# Patient Record
Sex: Female | Born: 1950 | Race: Black or African American | Hispanic: No | Smoking: Never smoker
Health system: Southern US, Community
[De-identification: ages and names within clinical notes are randomized; demographics above are authoritative.]

## PROBLEM LIST (undated history)

## (undated) DIAGNOSIS — M1990 Unspecified osteoarthritis, unspecified site: Secondary | ICD-10-CM

## (undated) DIAGNOSIS — I10 Essential (primary) hypertension: Secondary | ICD-10-CM

## (undated) DIAGNOSIS — K219 Gastro-esophageal reflux disease without esophagitis: Secondary | ICD-10-CM

## (undated) HISTORY — PX: ABDOMINAL HYSTERECTOMY: SHX81

## (undated) HISTORY — PX: CYSTECTOMY: SUR359

## (undated) HISTORY — PX: TONSILLECTOMY: SUR1361

---

## 2006-12-10 ENCOUNTER — Encounter: Admission: RE | Admit: 2006-12-10 | Discharge: 2006-12-10 | Payer: Self-pay | Admitting: Orthopedic Surgery

## 2006-12-17 ENCOUNTER — Ambulatory Visit: Payer: Self-pay | Admitting: Cardiovascular Disease

## 2006-12-17 ENCOUNTER — Observation Stay (HOSPITAL_COMMUNITY): Admission: AD | Admit: 2006-12-17 | Discharge: 2006-12-18 | Payer: Self-pay | Admitting: Orthopedic Surgery

## 2010-10-25 ENCOUNTER — Encounter
Admission: RE | Admit: 2010-10-25 | Discharge: 2010-10-25 | Payer: Self-pay | Source: Home / Self Care | Attending: Family Medicine | Admitting: Family Medicine

## 2011-03-21 NOTE — Consult Note (Signed)
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  Notes no change in her ability to do this.  No chest pressure, no  shortness of breath.   About a week or 2 ago she was coming down the stairs and she fell and  had a rotator cuff injury.  She presents today for repair.   We were asked to see regarding abnormal EKG and risk factors.   ALLERGIES:  EGGS.   MEDICATIONS PRIOR TO ADMISSION:  Premarin and aspirin.   PAST MEDICAL HISTORY:  1. Rotator cuff tear.  2. Status post hysterectomy.  3. Status post umbilical surgery repair.  4. Status post laser tissue removal, throat.  5. Obesity.   SOCIAL HISTORY:  The patient lives in [place], she is married, she is  a Development worker, community.  She does not smoke, does not drink.  She is active without  limitations, though not in an organized exercise fashion.   FAMILY HISTORY:  Mother is alive and well at age 66, father died at age  37, no history of CAD.   REVIEW OF SYSTEMS:  All systems reviewed, negative to the above problem  except as noted.   PHYSICAL EXAM:  GENERAL:  When I examine, the patient is in no distress.  VITAL SIGNS:  Blood pressure is 166/88 (the patient has not noted that  it has been high before), pulse is 99 and regular, temperature is 98.  HEENT:  Normocephalic, atraumatic.  PERRL.  EOMI.  Throat clear.  NECK:  Supple.  No JVD, no bruits.  LUNGS:  Relatively clear.  CARDIAC EXAM:  Regular rate and rhythm, S1 and S2, no S3 or S4.  No  murmurs.  ABDOMEN:  Supple, nontender, no  hepatomegaly.  EXTREMITIES:  Good dorsalis pedis pulses, no lower extremity edema.  NEURO EXAM:  Alert and oriented x3.  Cranial nerves II through XII  grossly intact.  Strength:  Patient moving all extremities.   Chest x-ray on February 13 shows no acute disease.  A 12-lead EKG shows  normal sinus rhythm at 86 beats per minute.  Poor R wave progression  throughout anterior precordium, left axis, left anterior fascicular  block, left axis deviation.   LABS:  Significant for a hemoglobin of 13, WBC of 9, BUN and creatinine  of 9 and 0.7, potassium of 3.3.  Glucose of 151 non-fasting.   IMPRESSION:  Patient is a 60 year old woman with no known history of  coronary disease, has a history of obesity.  Blood pressure is high  today, though it has not been high in the past per her report.  Sugars  are a little high, though  she has no history of diabetes.   EKG most likely reflects lead placement as she has large breasts and,  again, it can lead to poor R wave progression.  There is no definite  evidence for an MI.  In fact, there are very small R waves noted in V1  through V3.   She is active without limitation, in clinic seeing 30 patients, has  noted no change, and denies chest pain.  Overall, I feel she is at low  risk for a major cardiac event and is okay to proceed with the planned  surgery.   Again, after surgery, continue to follow blood pressure.  We will need  follow-up electrolytes and glucose check.  Encouraged her to stay  active.      Pricilla Riffle, MD, [facility]  Electronically Signed     PVR/MEDQ  D:  12/17/2006  T:  12/18/2006  Job:  [id number]

## 2011-03-21 NOTE — Op Note (Signed)
Bridget Perez, Bridget Perez         ACCOUNT NO.:  [id number]   MEDICAL RECORD NO.:  [id number]          PATIENT TYPE:  INP   LOCATION:  2899                         FACILITY:  [facility]   PHYSICIAN:  Myrtie Neither, MD      DATE OF BIRTH:  [birth year]   DATE OF PROCEDURE:  12/17/2006  DATE OF DISCHARGE:                               OPERATIVE REPORT   PREOPERATIVE DIAGNOSIS:  Rotator cuff tear, right shoulder.   POSTOPERATIVE DIAGNOSIS:  Rotator cuff tear, right shoulder, impingement  syndrome, right shoulder.   ANESTHESIA:  General.   PROCEDURE:  1. Arthroscopic acromioplasty and synovectomy.  2. Mini-rotator cuff repair using anchor suture and #2 FiberWire      suture.   The patient was taken to the operating room. After being given adequate  preop medications, given general anesthesia and intubated. Her right  shoulder was prepped with DuraPrep and draped in sterile manner.  A 1/2  inch puncture wound was made posteriorly.  A Swisher rod was placed  posterior to anterior. Anterior inflow water incision was then made.  A  lateral incision was made for the arthroscope and inspection of the  joint revealed complete disruption of the rotator cuff with retraction  of the rotator cuff, chronic synovitic changes of the subacromial bursal  sac, and chondromalacia changes of the subacromial surface.  With the  use of the arthroscopic shaver, a complete synovectomy was done and  decompression followed by the use of a bur for acromioplasty.  After  adequate acromioplasty and decompression was done and other debridement  of hypertrophic subacromial bursal tissue, a mini-incision was made  extending the lateral incision approximately 1 1/2 inch, going through  the skin and subcutaneous tissue down to the fascia.  The deltoid was  split.  Complete disruption of the rotator cuff was identified. With the  use of the Kocher, the cuff tendon was able to be pulled back down.  A  trough was made over  the tuberosity of the humerus with the use of a bur  and osteotome.  Next, an anchor suture was placed into the tuberosity.  The anchor sutures were placed into the cuff tendon pulling it back down  to the tuberosity.  FiberWire was also used to further close the rotator  cuff without losing gaps.  Two small puncture holes were made into the  tuberosity, itself, and FiberWire sutures run through it into the cuff.  A complete closure was possible.  Copious irrigation was then done  followed by 2-0 Vicryl for the deltoid, 2-0 for the subcutaneous, and  skin staples for the skin.  A bulky compressive dressing was applied.  14 mL of 0.5% Marcaine with epinephrine was injected into the area.  A  shoulder abduction pillow brace was applied.  The patient tolerated the  procedure quite well and went to the recovery room in stable and  satisfactory condition.  The patient is being kept for 23 hour  observation for pain control. The patient will be discharged on Percocet  1-2 q.4h. p.r.n. for pain.  Continue ice packs.  Use a Nerf ball for  gripping and  to be seen back in the office in one week.      Myrtie Neither, MD  Electronically Signed    AC/MEDQ  D:  12/17/2006  T:  12/17/2006  Job:  [id number]

## 2014-03-09 ENCOUNTER — Ambulatory Visit: Payer: Self-pay | Admitting: Podiatry

## 2014-03-15 ENCOUNTER — Ambulatory Visit: Payer: Self-pay | Admitting: Podiatry

## 2014-03-20 ENCOUNTER — Ambulatory Visit: Payer: Self-pay | Admitting: Podiatry

## 2017-09-23 DIAGNOSIS — M180 Bilateral primary osteoarthritis of first carpometacarpal joints: Secondary | ICD-10-CM | POA: Insufficient documentation

## 2017-09-23 DIAGNOSIS — G5603 Carpal tunnel syndrome, bilateral upper limbs: Secondary | ICD-10-CM | POA: Insufficient documentation

## 2017-09-23 DIAGNOSIS — R52 Pain, unspecified: Secondary | ICD-10-CM | POA: Insufficient documentation

## 2017-10-01 ENCOUNTER — Other Ambulatory Visit: Payer: Self-pay | Admitting: Orthopedic Surgery

## 2017-10-06 ENCOUNTER — Encounter (HOSPITAL_BASED_OUTPATIENT_CLINIC_OR_DEPARTMENT_OTHER): Payer: Self-pay | Admitting: *Deleted

## 2017-10-06 ENCOUNTER — Other Ambulatory Visit: Payer: Self-pay

## 2017-10-08 ENCOUNTER — Ambulatory Visit (HOSPITAL_BASED_OUTPATIENT_CLINIC_OR_DEPARTMENT_OTHER): Payer: BLUE CROSS/BLUE SHIELD | Admitting: Anesthesiology

## 2017-10-08 ENCOUNTER — Other Ambulatory Visit: Payer: Self-pay

## 2017-10-08 ENCOUNTER — Ambulatory Visit (HOSPITAL_BASED_OUTPATIENT_CLINIC_OR_DEPARTMENT_OTHER)
Admission: RE | Admit: 2017-10-08 | Discharge: 2017-10-08 | Disposition: A | Discharge status: Home / Self Care | Payer: BLUE CROSS/BLUE SHIELD | Source: Ambulatory Visit | Attending: Orthopedic Surgery | Admitting: Orthopedic Surgery

## 2017-10-08 ENCOUNTER — Encounter (HOSPITAL_BASED_OUTPATIENT_CLINIC_OR_DEPARTMENT_OTHER)
Admission: RE | Disposition: A | Discharge status: Home / Self Care | Payer: Self-pay | Source: Ambulatory Visit | Attending: Orthopedic Surgery

## 2017-10-08 ENCOUNTER — Encounter (HOSPITAL_BASED_OUTPATIENT_CLINIC_OR_DEPARTMENT_OTHER): Payer: Self-pay

## 2017-10-08 DIAGNOSIS — K219 Gastro-esophageal reflux disease without esophagitis: Secondary | ICD-10-CM | POA: Insufficient documentation

## 2017-10-08 DIAGNOSIS — M1990 Unspecified osteoarthritis, unspecified site: Secondary | ICD-10-CM | POA: Diagnosis not present

## 2017-10-08 DIAGNOSIS — G5603 Carpal tunnel syndrome, bilateral upper limbs: Secondary | ICD-10-CM | POA: Insufficient documentation

## 2017-10-08 HISTORY — PX: CARPAL TUNNEL RELEASE: SHX101

## 2017-10-08 HISTORY — DX: Unspecified osteoarthritis, unspecified site: M19.90

## 2017-10-08 HISTORY — DX: Gastro-esophageal reflux disease without esophagitis: K21.9

## 2017-10-08 SURGERY — CARPAL TUNNEL RELEASE
Anesthesia: Regional | Site: Wrist | Laterality: Right

## 2017-10-08 MED ORDER — CEFAZOLIN SODIUM-DEXTROSE 2-4 GM/100ML-% IV SOLN
INTRAVENOUS | Status: AC
  Filled 2017-10-08: qty 100

## 2017-10-08 MED ORDER — PHENYLEPHRINE 40 MCG/ML (10ML) SYRINGE FOR IV PUSH (FOR BLOOD PRESSURE SUPPORT)
PREFILLED_SYRINGE | INTRAVENOUS | Status: AC
  Filled 2017-10-08: qty 10

## 2017-10-08 MED ORDER — LACTATED RINGERS IV SOLN
INTRAVENOUS | Status: DC
  Administered 2017-10-08: 08:00:00 via INTRAVENOUS

## 2017-10-08 MED ORDER — LIDOCAINE 2% (20 MG/ML) 5 ML SYRINGE
INTRAMUSCULAR | Status: AC
  Filled 2017-10-08: qty 5

## 2017-10-08 MED ORDER — SCOPOLAMINE 1 MG/3DAYS TD PT72: 1.0000 | MEDICATED_PATCH | Freq: Once | TRANSDERMAL | Status: DC | PRN

## 2017-10-08 MED ORDER — CEFAZOLIN SODIUM-DEXTROSE 2-4 GM/100ML-% IV SOLN: 2.0000 g | INTRAVENOUS | Status: DC

## 2017-10-08 MED ORDER — ONDANSETRON HCL 4 MG/2ML IJ SOLN
INTRAMUSCULAR | Status: AC
  Filled 2017-10-08: qty 2

## 2017-10-08 MED ORDER — ONDANSETRON HCL 4 MG/2ML IJ SOLN
INTRAMUSCULAR | Status: DC | PRN
  Administered 2017-10-08: 4 mg via INTRAVENOUS

## 2017-10-08 MED ORDER — MIDAZOLAM HCL 2 MG/2ML IJ SOLN
1.0000 mg | INTRAMUSCULAR | Status: DC | PRN
  Administered 2017-10-08: 2 mg via INTRAVENOUS

## 2017-10-08 MED ORDER — FENTANYL CITRATE (PF) 100 MCG/2ML IJ SOLN
INTRAMUSCULAR | Status: AC
  Filled 2017-10-08: qty 2

## 2017-10-08 MED ORDER — MIDAZOLAM HCL 2 MG/2ML IJ SOLN
INTRAMUSCULAR | Status: AC
  Filled 2017-10-08: qty 2

## 2017-10-08 MED ORDER — SUCCINYLCHOLINE CHLORIDE 200 MG/10ML IV SOSY
PREFILLED_SYRINGE | INTRAVENOUS | Status: AC
  Filled 2017-10-08: qty 10

## 2017-10-08 MED ORDER — CHLORHEXIDINE GLUCONATE 4 % EX LIQD: 60.0000 mL | Freq: Once | CUTANEOUS | Status: DC

## 2017-10-08 MED ORDER — LIDOCAINE HCL (CARDIAC) 20 MG/ML IV SOLN
INTRAVENOUS | Status: DC | PRN
  Administered 2017-10-08: 30 mg via INTRAVENOUS

## 2017-10-08 MED ORDER — EPHEDRINE 5 MG/ML INJ
INTRAVENOUS | Status: AC
Start: 2017-10-08 — End: 2017-10-08
  Filled 2017-10-08: qty 10

## 2017-10-08 MED ORDER — BUPIVACAINE HCL (PF) 0.25 % IJ SOLN
INTRAMUSCULAR | Status: DC | PRN
  Administered 2017-10-08: 9 mL

## 2017-10-08 MED ORDER — PROPOFOL 10 MG/ML IV BOLUS
INTRAVENOUS | Status: DC | PRN
  Administered 2017-10-08 (×2): 20 mg via INTRAVENOUS

## 2017-10-08 MED ORDER — FENTANYL CITRATE (PF) 100 MCG/2ML IJ SOLN
50.0000 ug | INTRAMUSCULAR | Status: DC | PRN
  Administered 2017-10-08: 100 ug via INTRAVENOUS

## 2017-10-08 MED ORDER — DEXAMETHASONE SODIUM PHOSPHATE 10 MG/ML IJ SOLN
INTRAMUSCULAR | Status: AC
  Filled 2017-10-08: qty 1

## 2017-10-08 SURGICAL SUPPLY — 36 items
BLADE SURG 15 STRL LF DISP TIS (BLADE) ×1 IMPLANT
BLADE SURG 15 STRL SS (BLADE) ×3
BNDG CMPR 9X4 STRL LF SNTH (GAUZE/BANDAGES/DRESSINGS)
BNDG COHESIVE 3X5 TAN STRL LF (GAUZE/BANDAGES/DRESSINGS) ×3 IMPLANT
BNDG ESMARK 4X9 LF (GAUZE/BANDAGES/DRESSINGS) IMPLANT
BNDG GAUZE ELAST 4 BULKY (GAUZE/BANDAGES/DRESSINGS) ×3 IMPLANT
CHLORAPREP W/TINT 26ML (MISCELLANEOUS) ×3 IMPLANT
CORD BIPOLAR FORCEPS 12FT (ELECTRODE) ×3 IMPLANT
COVER BACK TABLE 60X90IN (DRAPES) ×3 IMPLANT
COVER MAYO STAND STRL (DRAPES) ×3 IMPLANT
CUFF TOURNIQUET SINGLE 18IN (TOURNIQUET CUFF) ×3 IMPLANT
DRAPE EXTREMITY T 121X128X90 (DRAPE) ×3 IMPLANT
DRAPE SURG 17X23 STRL (DRAPES) ×3 IMPLANT
DRSG PAD ABDOMINAL 8X10 ST (GAUZE/BANDAGES/DRESSINGS) ×3 IMPLANT
GAUZE SPONGE 4X4 12PLY STRL (GAUZE/BANDAGES/DRESSINGS) ×3 IMPLANT
GAUZE XEROFORM 1X8 LF (GAUZE/BANDAGES/DRESSINGS) ×3 IMPLANT
GLOVE BIOGEL PI IND STRL 7.0 (GLOVE) IMPLANT
GLOVE BIOGEL PI IND STRL 8.5 (GLOVE) ×1 IMPLANT
GLOVE BIOGEL PI INDICATOR 7.0 (GLOVE) ×4
GLOVE BIOGEL PI INDICATOR 8.5 (GLOVE) ×2
GLOVE ECLIPSE 6.5 STRL STRAW (GLOVE) ×2 IMPLANT
GLOVE SURG ORTHO 8.0 STRL STRW (GLOVE) ×3 IMPLANT
GOWN STRL REUS W/ TWL LRG LVL3 (GOWN DISPOSABLE) ×1 IMPLANT
GOWN STRL REUS W/TWL LRG LVL3 (GOWN DISPOSABLE) ×3
GOWN STRL REUS W/TWL XL LVL3 (GOWN DISPOSABLE) ×3 IMPLANT
NDL PRECISIONGLIDE 27X1.5 (NEEDLE) IMPLANT
NEEDLE PRECISIONGLIDE 27X1.5 (NEEDLE) IMPLANT
NS IRRIG 1000ML POUR BTL (IV SOLUTION) ×3 IMPLANT
PACK BASIN DAY SURGERY FS (CUSTOM PROCEDURE TRAY) ×3 IMPLANT
STOCKINETTE 4X48 STRL (DRAPES) ×3 IMPLANT
SUT ETHILON 4 0 PS 2 18 (SUTURE) ×3 IMPLANT
SUT VICRYL 4-0 PS2 18IN ABS (SUTURE) IMPLANT
SYR BULB 3OZ (MISCELLANEOUS) ×3 IMPLANT
SYR CONTROL 10ML LL (SYRINGE) IMPLANT
TOWEL OR 17X24 6PK STRL BLUE (TOWEL DISPOSABLE) ×3 IMPLANT
UNDERPAD 30X30 (UNDERPADS AND DIAPERS) ×3 IMPLANT

## 2017-10-08 NOTE — Op Note (Signed)
Dictation Number [id number]

## 2017-10-08 NOTE — Anesthesia Postprocedure Evaluation (Signed)
Anesthesia Post Note  Patient: Psychologist, sport and exerciseVeita Bland-Spencer  Procedure(s) Performed: RIGHT CARPAL TUNNEL RELEASE (Right Wrist)     Patient location during evaluation: PACU Anesthesia Type: Bier Block Level of consciousness: awake and alert Pain management: pain level controlled Vital Signs Assessment: post-procedure vital signs reviewed and stable Respiratory status: spontaneous breathing, nonlabored ventilation and respiratory function stable Cardiovascular status: stable and blood pressure returned to baseline Postop Assessment: no apparent nausea or vomiting Anesthetic complications: no    Last Vitals:  Vitals:   10/08/17 0911 10/08/17 0915  BP:  112/73  Pulse: 81 79  Resp: 15 19  Temp:    SpO2: 100% 100%    Last Pain:  Vitals:   10/08/17 0915  TempSrc:   PainSc: 0-No pain                 Cecile HearingStephen Edward Makina Skow

## 2017-10-08 NOTE — Op Note (Signed)
[name],               ACCOUNT NO.:  [id number]  MEDICAL RECORD NO.:  [id number]  LOCATION:                                 FACILITY:  PHYSICIAN:  Cindee SaltGary Izell Labat, M.D.            DATE OF BIRTH:  DATE OF PROCEDURE:  10/08/2017 DATE OF DISCHARGE:                              OPERATIVE REPORT   PREOPERATIVE DIAGNOSIS:  Carpal tunnel syndrome, right hand.  POSTOPERATIVE DIAGNOSIS:  Carpal tunnel syndrome, right hand.  OPERATION:  Decompression right median nerve.  SURGEON:  Cindee SaltGary Deedra Pro, MD.  ANESTHESIA:  Forearm-based IV regional with IV sedation and local infiltration.  [facility]  ANESTHESIOLOGIST:  Desmond Lopeurk.  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  EMG nerve conductions are positive revealing carpal tunnel syndrome bilaterally.  She has elected to undergo surgical decompression rather than further conservative treatment.  Pre, peri, and postoperative course have been discussed along with risks and complications.  She is aware that there is no guarantee to the surgery; the possibility of infection; recurrence of injury to arteries, nerves, tendons; incomplete relief of symptoms; and dystrophy.  In preoperative area, the patient is seen, the extremity marked by both the patient and surgeon.  Antibiotic given.  DESCRIPTION OF PROCEDURE:  The patient was brought to the operating room, where a forearm-based IV regional anesthetic was carried out without difficulty under the direction of the anesthesia department. She was prepped using ChloraPrep in the supine position with the right arm free.  A 3-minute dry time was allowed, and a time-out taken confirming the patient and procedure.  A longitudinal incision was made in the right palm, carried down through subcutaneous tissue.  Bleeders were electrocauterized with bipolar.  The palmar fascia was split.  The superficial palmar arch was  identified.  The flexor tendon to the ring and little finger identified.  To the ulnar side of the median nerve, the carpal retinaculum was incised with sharp dissection.  A right angle and Sewell retractor were placed between the skin and forearm fascia. The fascia was released for approximately a 1.5 to 2 cm proximal to the wrist crease under direct vision.  Care was taken to dissect any deep structures away from the proximal forearm fascia, flexor retinaculum, and the nerve was explored.  An area of compression was immediately apparent.  The motor branch entered into muscle from the ulnar aspect of the nerve was intact.  The wound was copiously irrigated with saline. The skin was closed with interrupted 4-0 nylon sutures.  A local infiltration with 0.25% bupivacaine was given, approximately 9 mL was used.  A sterile compressive dressing with fingers free was applied.  On deflation of the tourniquet, all fingers immediately pinked.  She was taken to the recovery room for observation in satisfactory condition. She will be discharged home [facility], on Ultram, which he has at home.          ______________________________ Cindee SaltGary Erle Guster, M.D.     GK/MEDQ  D:  10/08/2017  T:  10/08/2017  Job:  [id number]

## 2017-10-08 NOTE — Brief Op Note (Signed)
10/08/2017  9:04 AM  PATIENT:  Bridget Perez  66 y.o. female  PRE-OPERATIVE DIAGNOSIS:  RIGHT CARPAL TUNNEL SYNDROME  POST-OPERATIVE DIAGNOSIS:  RIGHT CARPAL TUNNEL SYNDROME  PROCEDURE:  Procedure(s): RIGHT CARPAL TUNNEL RELEASE (Right)  SURGEON:  Surgeon(s) and Role:    * Cindee SaltKuzma, Vaishnavi Dalby, MD - Primary  PHYSICIAN ASSISTANT:   ASSISTANTS: none   ANESTHESIA:   local, regional and IV sedation  EBL:  2 mL   BLOOD ADMINISTERED:none  DRAINS: none   LOCAL MEDICATIONS USED:  BUPIVICAINE   SPECIMEN:  No Specimen  DISPOSITION OF SPECIMEN:  N/A  COUNTS:  YES  TOURNIQUET:   Total Tourniquet Time Documented: Forearm (Right) - 18 minutes Total: Forearm (Right) - 18 minutes   DICTATION: .Other Dictation: Dictation Number [id number]  PLAN OF CARE:discharge to home after PACU  PATIENT DISPOSITION:  PACU - hemodynamically stable.

## 2017-10-08 NOTE — Transfer of Care (Signed)
Immediate Anesthesia Transfer of Care Note  Patient: Bridget Perez  Procedure(s) Performed: RIGHT CARPAL TUNNEL RELEASE (Right Wrist)  Patient Location: PACU  Anesthesia Type:MAC and Bier block  Level of Consciousness: awake, alert  and oriented  Airway & Oxygen Therapy: Patient Spontanous Breathing and Patient connected to face mask oxygen  Post-op Assessment: Report given to RN and Post -op Vital signs reviewed and stable  Post vital signs: Reviewed and stable  Last Vitals:  Vitals:   10/08/17 0753  BP: 127/62  Pulse: 83  Resp: 18  Temp: 36.6 C  SpO2: 99%    Last Pain:  Vitals:   10/08/17 0753  TempSrc: Oral         Complications: No apparent anesthesia complications

## 2017-10-08 NOTE — Anesthesia Preprocedure Evaluation (Addendum)
Anesthesia Evaluation  Patient identified by MRN, date of birth, ID band Patient awake    Reviewed: Allergy & Precautions, NPO status , Patient's Chart, lab work & pertinent test results  Airway Mallampati: II  TM Distance: >3 FB Neck ROM: Full    Dental  (+) Teeth Intact, Dental Advisory Given   Pulmonary neg pulmonary ROS,    Pulmonary exam normal breath sounds clear to auscultation       Cardiovascular negative cardio ROS Normal cardiovascular exam Rhythm:Regular Rate:Normal     Neuro/Psych negative neurological ROS     GI/Hepatic Neg liver ROS, GERD  ,  Endo/Other  Obesity   Renal/GU negative Renal ROS     Musculoskeletal  (+) Arthritis , Osteoarthritis,    Abdominal   Peds  Hematology negative hematology ROS (+)   Anesthesia Other Findings Day of surgery medications reviewed with the patient.  Reproductive/Obstetrics                             Anesthesia Physical Anesthesia Plan  ASA: II  Anesthesia Plan: Bier Block and Bier Block-LIDOCAINE ONLY   Post-op Pain Management:    Induction: Intravenous  PONV Risk Score and Plan: 2 and Propofol infusion, Treatment may vary due to age or medical condition and Midazolam  Airway Management Planned: Nasal Cannula  Additional Equipment:   Intra-op Plan:   Post-operative Plan:   Informed Consent: I have reviewed the patients History and Physical, chart, labs and discussed the procedure including the risks, benefits and alternatives for the proposed anesthesia with the patient or authorized representative who has indicated his/her understanding and acceptance.   Dental advisory given  Plan Discussed with:   Anesthesia Plan Comments: (Risks/benefits of regional block discussed with patient including risk of bleeding, infection, nerve damage, and possibility of failed block.  Also discussed backup plan of general anesthesia and  associated risks.  Patient wishes to proceed.)        Anesthesia Quick Evaluation

## 2017-10-08 NOTE — Discharge Instructions (Signed)

## 2017-10-08 NOTE — H&P (Signed)
  Bridget Perez is an 66 y.o. female.   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 She states been going on for years. She is awake and 4 out of 7 nights. She complains of a sharp pain when it occurs with a VAS score 7/10. Driving does cause problems for her but has no other day fix activities nothing seems to make it better or worse. She has worn a splint taken prednisone which has helped. She has no history of injury to the hand or to the neck. She has no history of diabetes thyroid problems arthritis. Family history is positive diabetes negative for thyroid problems arthritis and gout. She has been tested for diabetes. She was sent to Dr. Riccardo DubinKarvelas for nerve conductions.Her nerve conductions are reviewed with her. She shows a motor delay of 6.9 on her right side is sent sensory delay of 6.8. Her motor delay on the left is 5.2 sensory delay 4.3              Past Medical History:  Diagnosis Date  . Arthritis   . GERD (gastroesophageal reflux disease)     Past Surgical History:  Procedure Laterality Date  . ABDOMINAL HYSTERECTOMY      History reviewed. No pertinent family history. Social History:  reports that  has never smoked. she has never used smokeless tobacco. She reports that she drinks alcohol. She reports that she does not use drugs.  Allergies: No Known Allergies  No medications prior to admission.    No results found for this or any previous visit (from the past 48 hour(s)).  No results found.   Pertinent items are noted in HPI.  Height 5\' 4"  (1.626 m), weight 99.8 kg (220 lb).  General appearance: alert, cooperative and appears stated age Head: Normocephalic, without obvious abnormality Neck: no JVD Resp: clear to auscultation bilaterally Cardio: regular rate and rhythm, S1,  S2 normal, no murmur, click, rub or gallop GI: soft, non-tender; bowel sounds normal; no masses,  no organomegaly Extremities: numbness right hand Pulses: 2+ and symmetric Skin: Skin color, texture, turgor normal. No rashes or lesions Neurologic: Grossly normal Incision/Wound: na  Assessment/Plan  1. Primary osteoarthritis of both first carpometacarpal joints  2. Bilateral carpal tunnel syndrome    Plan: She would like to proceed to have a right carpal tunnel release. Pre-peri-and postoperative course were discussed along with her. She is aware there is no guarantee to the surgery the possibility of infection recurrence injury to arteries nerves tendons complete relief symptoms dystrophy. She is scheduled for right carpal tunnel release in outpatient under regional anesthesia.      [name] R 10/08/2017, 5:43 AM

## 2017-10-09 ENCOUNTER — Encounter (HOSPITAL_BASED_OUTPATIENT_CLINIC_OR_DEPARTMENT_OTHER): Payer: Self-pay | Admitting: Orthopedic Surgery

## 2018-03-18 ENCOUNTER — Ambulatory Visit
Admission: RE | Admit: 2018-03-18 | Discharge: 2018-03-18 | Disposition: A | Discharge status: Home / Self Care | Payer: BLUE CROSS/BLUE SHIELD | Source: Ambulatory Visit | Attending: Family Medicine | Admitting: Family Medicine

## 2018-03-18 ENCOUNTER — Other Ambulatory Visit: Payer: Self-pay | Admitting: Family Medicine

## 2018-03-18 DIAGNOSIS — R059 Cough, unspecified: Secondary | ICD-10-CM

## 2018-03-18 DIAGNOSIS — R05 Cough: Secondary | ICD-10-CM

## 2018-04-22 ENCOUNTER — Ambulatory Visit (INDEPENDENT_AMBULATORY_CARE_PROVIDER_SITE_OTHER): Payer: BLUE CROSS/BLUE SHIELD

## 2018-04-22 ENCOUNTER — Encounter: Payer: Self-pay | Admitting: Podiatry

## 2018-04-22 ENCOUNTER — Ambulatory Visit (INDEPENDENT_AMBULATORY_CARE_PROVIDER_SITE_OTHER): Payer: BLUE CROSS/BLUE SHIELD | Admitting: Podiatry

## 2018-04-22 DIAGNOSIS — M779 Enthesopathy, unspecified: Secondary | ICD-10-CM

## 2018-04-22 DIAGNOSIS — R52 Pain, unspecified: Secondary | ICD-10-CM

## 2018-04-22 DIAGNOSIS — M25572 Pain in left ankle and joints of left foot: Secondary | ICD-10-CM

## 2018-04-22 MED ORDER — TRIAMCINOLONE ACETONIDE 10 MG/ML IJ SUSP
10.0000 mg | Freq: Once | INTRAMUSCULAR | Status: AC
  Administered 2018-04-22: 10 mg

## 2018-04-22 NOTE — Progress Notes (Signed)
Subjective:   Patient ID: Bridget Perez, female   DOB: 67 y.o.   MRN: [id number]   HPI Patient presents with pain in the medial aspect of the left ankle that she states is been there for around a month and gradually getting worse but she is had a 30-year history of mild discomfort with severe flatfoot deformity left over right.  Patient does not smoke and likes to be active   Review of Systems  All other systems reviewed and are negative.       Objective:  Physical Exam  Constitutional: She appears well-developed and well-nourished.  Cardiovascular: Intact distal pulses.  Pulmonary/Chest: Effort normal.  Musculoskeletal: Normal range of motion.  Neurological: She is alert.  Skin: Skin is warm.  Nursing note and vitals reviewed.   Neurovascular status intact muscle strength is adequate with patient found to have severe flatfoot deformity left with minimal range of motion and crepitus with inflammation fluid around the medial side of the posterior tibial tendon as it comes under the medial malleolus.  The right shows moderate depression of the arch not to the same degree and patient is taking diclofenac and does have good digital perfusion and is well oriented x3     Assessment:  Acute posterior tibial tendinitis left with severe foot structural issues as compounding factor     Plan:  H&P conditions reviewed careful sheath injection administered left 3 mg Kenalog 5 Milgram Xylocaine applied fascial brace to lift the arch.  Discussed possibility for AFO bracing some day and possibility for MRI if symptoms do not get better to consider tear of the tendon  X-ray indicates severe flatfoot deformity left over right with multiple signs of midtarsal subtalar joint arthritis

## 2018-04-22 NOTE — Progress Notes (Signed)
dg 

## 2018-05-04 ENCOUNTER — Other Ambulatory Visit (HOSPITAL_COMMUNITY): Payer: Self-pay | Admitting: Family Medicine

## 2018-05-04 DIAGNOSIS — R1310 Dysphagia, unspecified: Secondary | ICD-10-CM

## 2018-05-13 ENCOUNTER — Ambulatory Visit (HOSPITAL_COMMUNITY): Payer: Medicare Other

## 2018-05-17 ENCOUNTER — Ambulatory Visit (HOSPITAL_COMMUNITY): Admission: RE | Admit: 2018-05-17 | Payer: BLUE CROSS/BLUE SHIELD | Source: Ambulatory Visit

## 2018-05-31 ENCOUNTER — Ambulatory Visit (HOSPITAL_COMMUNITY)
Admission: RE | Admit: 2018-05-31 | Discharge: 2018-05-31 | Disposition: A | Discharge status: Home / Self Care | Payer: BLUE CROSS/BLUE SHIELD | Source: Ambulatory Visit | Attending: Family Medicine | Admitting: Family Medicine

## 2018-05-31 DIAGNOSIS — R1310 Dysphagia, unspecified: Secondary | ICD-10-CM | POA: Diagnosis present

## 2018-05-31 DIAGNOSIS — K449 Diaphragmatic hernia without obstruction or gangrene: Secondary | ICD-10-CM | POA: Diagnosis not present

## 2018-05-31 DIAGNOSIS — K219 Gastro-esophageal reflux disease without esophagitis: Secondary | ICD-10-CM | POA: Diagnosis not present

## 2018-05-31 DIAGNOSIS — K224 Dyskinesia of esophagus: Secondary | ICD-10-CM | POA: Insufficient documentation

## 2020-02-24 ENCOUNTER — Encounter: Payer: Self-pay | Admitting: Podiatry

## 2020-02-24 ENCOUNTER — Other Ambulatory Visit: Payer: Self-pay

## 2020-02-24 ENCOUNTER — Ambulatory Visit (INDEPENDENT_AMBULATORY_CARE_PROVIDER_SITE_OTHER): Payer: BC Managed Care – PPO | Admitting: Podiatry

## 2020-02-24 VITALS — Temp 96.6°F

## 2020-02-24 DIAGNOSIS — M778 Other enthesopathies, not elsewhere classified: Secondary | ICD-10-CM | POA: Diagnosis not present

## 2020-02-24 DIAGNOSIS — M779 Enthesopathy, unspecified: Secondary | ICD-10-CM

## 2020-02-27 NOTE — Progress Notes (Signed)
Subjective:   Patient ID: Bridget Perez, female   DOB: 69 y.o.   MRN: [id number]   HPI Patient presents stating her left ankle has flared up again recently   ROS      Objective:  Physical Exam  Neurovascular status intact with discomfort around the posterior tibial tendon as it comes under the medial malleolus left inserting into the navicular with moderate flatfoot deformity with patient who has lost significant weight     Assessment:  Posterior tibial tendinitis left with inflammation and foot structural changes     Plan:  Reviewed condition sterile prep done injected the tendon 3 mg Kenalog followed M Xylocaine after sterile prep and advised on supportive shoes

## 2020-07-06 ENCOUNTER — Ambulatory Visit (INDEPENDENT_AMBULATORY_CARE_PROVIDER_SITE_OTHER): Payer: BC Managed Care – PPO

## 2020-07-06 ENCOUNTER — Encounter: Payer: Self-pay | Admitting: Podiatry

## 2020-07-06 ENCOUNTER — Other Ambulatory Visit: Payer: Self-pay

## 2020-07-06 ENCOUNTER — Other Ambulatory Visit: Payer: Self-pay | Admitting: Podiatry

## 2020-07-06 ENCOUNTER — Ambulatory Visit (INDEPENDENT_AMBULATORY_CARE_PROVIDER_SITE_OTHER): Payer: BC Managed Care – PPO | Admitting: Podiatry

## 2020-07-06 DIAGNOSIS — M76822 Posterior tibial tendinitis, left leg: Secondary | ICD-10-CM | POA: Diagnosis not present

## 2020-07-06 DIAGNOSIS — R609 Edema, unspecified: Secondary | ICD-10-CM

## 2020-07-06 DIAGNOSIS — M1990 Unspecified osteoarthritis, unspecified site: Secondary | ICD-10-CM | POA: Diagnosis not present

## 2020-07-06 NOTE — Progress Notes (Signed)
Subjective:   Patient ID: Bridget Perez, female   DOB: 69 y.o.   MRN: [id number]   HPI Patient states she started to develop pain around her left ankle again in the last few weeks and has been worse this time worse when she tries to be on it and then at nighttime.  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  I do think that ultimately triple arthrodesis will be necessary and she understands this but she also needs her right knee replacement and does not know when this would work for her due to work.  We are still trying to get it better conservatively and I did go ahead I did sterile prep I injected the posterior tibial as it comes across the medial malleolus 3 mg dexamethasone Kenalog 5 mg Xylocaine and I applied air fracture walker to mobilize and discussed MRI at 1 point future if possible AFO brace  X-rays indicate that there is collapse medial longitudinal arch left with multiple signs of arthritis of the subtalar joint calcaneocuboid talonavicular joint

## 2020-08-10 ENCOUNTER — Ambulatory Visit (INDEPENDENT_AMBULATORY_CARE_PROVIDER_SITE_OTHER): Payer: BC Managed Care – PPO | Admitting: Podiatry

## 2020-08-10 ENCOUNTER — Other Ambulatory Visit: Payer: Self-pay

## 2020-08-10 ENCOUNTER — Encounter: Payer: Self-pay | Admitting: Podiatry

## 2020-08-10 DIAGNOSIS — M76822 Posterior tibial tendinitis, left leg: Secondary | ICD-10-CM | POA: Diagnosis not present

## 2020-08-10 NOTE — Progress Notes (Signed)
Subjective:   Patient ID: Bridget Perez, female   DOB: 69 y.o.   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  We are trying to continue to work this conservatively due to her work schedule and I spoke with Biomedical scientist and will get a try to make her a brace or a deep type of an orthotic to try to cuff the area and get her into a better functioning position.  Patient will be seen back for this by ped orthotist and education rendered concerning condition with patient today.  I did dispense fascial brace to try to get temporary lifting of the arch

## 2020-08-24 ENCOUNTER — Telehealth: Payer: Self-pay | Admitting: Podiatry

## 2020-08-24 NOTE — Telephone Encounter (Signed)
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   I returned call and left my direct number for Dr Parke Simmers to call to schedule an appt.

## 2020-09-17 ENCOUNTER — Other Ambulatory Visit: Payer: Self-pay | Admitting: Family Medicine

## 2020-09-17 ENCOUNTER — Other Ambulatory Visit: Payer: Self-pay

## 2020-09-17 ENCOUNTER — Ambulatory Visit
Admission: RE | Admit: 2020-09-17 | Discharge: 2020-09-17 | Disposition: A | Discharge status: Home / Self Care | Payer: Self-pay | Source: Ambulatory Visit | Attending: Family Medicine | Admitting: Family Medicine

## 2020-09-17 DIAGNOSIS — T1490XA Injury, unspecified, initial encounter: Secondary | ICD-10-CM

## 2021-02-02 ENCOUNTER — Other Ambulatory Visit (HOSPITAL_BASED_OUTPATIENT_CLINIC_OR_DEPARTMENT_OTHER): Payer: Self-pay

## 2021-04-20 ENCOUNTER — Emergency Department (HOSPITAL_BASED_OUTPATIENT_CLINIC_OR_DEPARTMENT_OTHER)
Admission: EM | Admit: 2021-04-20 | Discharge: 2021-04-21 | Disposition: A | Discharge status: Home / Self Care | Payer: BC Managed Care – PPO | Attending: Emergency Medicine | Admitting: Emergency Medicine

## 2021-04-20 ENCOUNTER — Encounter (HOSPITAL_BASED_OUTPATIENT_CLINIC_OR_DEPARTMENT_OTHER): Payer: Self-pay | Admitting: Emergency Medicine

## 2021-04-20 ENCOUNTER — Other Ambulatory Visit: Payer: Self-pay

## 2021-04-20 DIAGNOSIS — S99922A Unspecified injury of left foot, initial encounter: Secondary | ICD-10-CM | POA: Diagnosis present

## 2021-04-20 DIAGNOSIS — S91352A Open bite, left foot, initial encounter: Secondary | ICD-10-CM | POA: Diagnosis not present

## 2021-04-20 DIAGNOSIS — W5911XA Bitten by nonvenomous snake, initial encounter: Secondary | ICD-10-CM | POA: Insufficient documentation

## 2021-04-20 DIAGNOSIS — Z5321 Procedure and treatment not carried out due to patient leaving prior to being seen by health care provider: Secondary | ICD-10-CM | POA: Diagnosis not present

## 2021-04-20 MED ORDER — ACETAMINOPHEN 325 MG PO TABS
650.0000 mg | ORAL_TABLET | Freq: Once | ORAL | Status: AC
  Administered 2021-04-20: 650 mg via ORAL
  Filled 2021-04-20: qty 2

## 2021-04-20 NOTE — ED Triage Notes (Addendum)
Pt arrives pov, reports possible snake bite on left foot 30 minutes pta. Pt reports that she did not see what injured foot. No swelling noted. Pt reports throbbing pain. Redness and tenderness noted, unable to visualize puncture marks.

## 2021-04-20 NOTE — ED Notes (Signed)
Bridget Perez, EDPA to triage for [facility]

## 2021-04-20 NOTE — ED Notes (Signed)
Pt not in lobby at this time.

## 2021-04-20 NOTE — ED Provider Notes (Signed)
Emergency Medicine Provider Triage Evaluation Note  Bridget Perez , a 70 y.o. female  was evaluated in triage.  Pt complains of left toe pain.  She states that she was on the deck of her house removing covers from furniture when she feels that something bit her on the left great toe.  She states that this occurred at approximately 7:00.  She denies any swelling she states that she occasionally has moments of pain in her left toe.  Review of Systems  Positive: Left great toe pain Negative: Fevers  Physical Exam  BP 135/70 (BP Location: Right Arm)   Pulse 75   Temp 99.1 F (37.3 C) (Oral)   Resp 20   Ht 5\' 3"  (1.6 m)   Wt 76.2 kg   SpO2 100%   BMI 29.76 kg/m  Gen:   Awake, no distress  Resp:  Normal effort MSK:   Moves extremities without difficulty Other:      Medical Decision Making  Medically screening exam initiated at 7:51 PM.  Appropriate orders placed.  Mikele Bland-Spencer was informed that the remainder of the evaluation will be completed by another provider, this initial triage assessment does not replace that evaluation, and the importance of remaining in the ED until their evaluation is complete.  Patient states she does not appreciate any swelling to her foot or toe I do not appreciate any either.  She does have a fallen arch on her left foot and her ankle does appear somewhat large however she states that is normal for her.  Recommended that patient wait in ER for full evaluation.  She is understanding of this plan.  Given Tylenol for discomfort.  Anticipate patient will be reasonable discharge home after short observation period.    Adrian Saran [place], DOLE 04/20/21 1954    04/22/21, MD 04/23/21 1432

## 2021-08-19 ENCOUNTER — Ambulatory Visit: Payer: BC Managed Care – PPO | Admitting: Podiatry

## 2021-08-19 ENCOUNTER — Ambulatory Visit (INDEPENDENT_AMBULATORY_CARE_PROVIDER_SITE_OTHER): Payer: BC Managed Care – PPO

## 2021-08-19 ENCOUNTER — Ambulatory Visit: Payer: BC Managed Care – PPO

## 2021-08-19 ENCOUNTER — Other Ambulatory Visit: Payer: Self-pay

## 2021-08-19 ENCOUNTER — Encounter: Payer: Self-pay | Admitting: Podiatry

## 2021-08-19 DIAGNOSIS — M76822 Posterior tibial tendinitis, left leg: Secondary | ICD-10-CM

## 2021-08-19 DIAGNOSIS — M19072 Primary osteoarthritis, left ankle and foot: Secondary | ICD-10-CM | POA: Diagnosis not present

## 2021-08-19 NOTE — Progress Notes (Signed)
Subjective:   Patient ID: Bridget Perez, female   DOB: 70 y.o.   MRN: [id number]   HPI Patient presents stating she is having a lot of pain in her left foot and states it was worse but she started an anti-inflammatory but some improved.  She knows she is getting need surgery but is trying to connect this also with the knee on her right foot and has not been seen in over a year   ROS      Objective:  Physical Exam  Neurovascular status unchanged weight doing very well with patient found to have quite a bit of inflammation around the medial and lateral ankle but no increased erythema noted or significant edema.  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  Ultimately she is going to require surgery but first I would like to be able to put her into some form of brace and have recommended an AFO bracing system to try to provide stability if she most likely cannot have surgery for at least 12 to 18 months.  Patient will be seen back for [place] to recheck again all questions discussed and answered today  X-rays indicate that there is severe collapse medial longitudinal arch left with complete reduction of the arch height multiple signs of arthritic processes

## 2022-02-05 ENCOUNTER — Ambulatory Visit (INDEPENDENT_AMBULATORY_CARE_PROVIDER_SITE_OTHER): Payer: BC Managed Care – PPO

## 2022-02-05 ENCOUNTER — Ambulatory Visit: Payer: Medicare Other

## 2022-02-05 ENCOUNTER — Encounter: Payer: Self-pay | Admitting: Podiatry

## 2022-02-05 ENCOUNTER — Ambulatory Visit: Payer: BC Managed Care – PPO | Admitting: Podiatry

## 2022-02-05 DIAGNOSIS — M76822 Posterior tibial tendinitis, left leg: Secondary | ICD-10-CM

## 2022-02-05 DIAGNOSIS — M7752 Other enthesopathy of left foot: Secondary | ICD-10-CM

## 2022-02-05 DIAGNOSIS — M19072 Primary osteoarthritis, left ankle and foot: Secondary | ICD-10-CM

## 2022-02-05 MED ORDER — TRIAMCINOLONE ACETONIDE 10 MG/ML IJ SUSP
10.0000 mg | Freq: Once | INTRAMUSCULAR | Status: AC
  Administered 2022-02-05: 10 mg

## 2022-02-05 NOTE — Progress Notes (Signed)
SITUATION ?Patient Name:  Bridget Perez ?MRN:   [id number] ?Reason for Visit: Evaluation for [facility] AFO ? ?Patient Report: ?Chief Complaint:   Pain in ambulation ?Nature of Discomfort/Pain:  Ambulatory ?Location:    left lower extremity ?Onset & Duration:   Gradual and Present longer than 3 months ?Course:    gradually worsening ?Aggravating or Alleviating Factors: Abulation ? ?OBJECTIVE DATA & MEASUREMENTS ?Prognosis:    Good ?Duration of use:   5 years ? ?Diagnosis: ?  ICD-10-CM   ?1. Posterior tibial tendinitis of left lower extremity  M76.822   ?  ?2. Osteoarthritis of left ankle and foot  M19.072   ?  ? ? ?GOALS, NECESSITIES, & JUSTIFICATIONS ?Recommended Device: [employer] ?Color:    Black ?Closure:   Laces ? ?Laterality HCPCS Code Description Justification  ?left L950229 Plastic orthosis, articulated, custom molded from a model of the patient, custom fabricated, includes casting and cast preparation. Necessary to provide triplanar support to the foot/ankle complex  ?left L2200 x2 Limited motion per joint Necessary to prevent pathological coronal motion  ?left L2275 Varus / valgus plastic control Necessary to support in the coronal plane  ?left L2330 Addition to lower extremity, lacer molded to patient model Necessary to ensure secure hold of orthosis to patient's limb  ?left L2820 Addition to lower extremity orthosis, soft interface for molded plastic below knee section Necessary to relieve pressure on bony prominences  ? ? ?I certify that Avery Eustice qualifies for and will benefit from an ankle foot orthosis used during ambulation based on meeting all of the following criteria;  ? ?The patient is: ?- Ambulatory, and ?- Has weakness or deformity of the foot and ankle, and ?- Requires stabilization for medical reasons, and ?- Has the potential to benefit functionally ? ?The patient?s medical record contains sufficient documentation of the patients medical condition to substantiate  the necessity for the type and quantity of the items ordered. ? ?The goals of this therapy: ?- Improve Mobility ?- Improve Lower Extremity Stability ?- Decrease Pain ?- Facilitate Soft Tissue Healing ?- Facilitate Immobilization, healing and treatment of an injury ? ?Necessity of Ankle Foot Orthotic molded to patient model: ?A custom (vs. prefabricated) ankle foot orthosis has been prescribed based on the following criteria which are specific to the condition of this patient; ?- The patient could not be fit with a prefabricated AFO ?- The condition necessitating the orthosis is expected to be permanent or of longstanding duration (more than 6 months) ?- There is need to control the ankle or foot in more than one plane ?- The patient has a documented neurological, circulatory, or orthopedic condition that requires custom fabrication over a model to prevent tissue injury ?- The patient has a healing fracture that lacks normal anatomical integrity or anthropometric proportions ? ?I hereby certify that the ankle foot orthotic described above is a rigid or semi-rigid device which is used for the purpose of supporting a weak or deformed body member or restricting or eliminating motion in a diseased or injured part of the body. It is designed to provide support and counterforce on the limb or body part that is being braced. In my opinion, the custom molded ankle foot orthosis is both reasonable and necessary in reference to accepted standards of medical practice in the treatment  ?of the patient condition and rehabilitation. ? ?ACTIONS PERFORMED ?Patient was evaluated and casted for [place] AFO via [employer]. Procedure was explained to patient. Patient tolerated procedure. patient selected device  color and closure method.  ? ?PLAN ?Patient to return in four to six weeks for fitting and delivery of device. Plan of care was explained to and agreed upon by patient. All questions were answered and concerns  addressed. ? ? ? ? ? ?

## 2022-02-05 NOTE — Progress Notes (Signed)
Subjective:  ? ?Patient ID: Bridget Perez, female   DOB: 71 y.o.   MRN: [id number]  ? ?HPI ?Patient presents with continued severe flattening of the arch left that is continuing to be difficult for her and severe right knee pain.  States that she is due to go out of town July has trouble being able to walk any distances with her foot and knows that she is going to need surgery someday but trying to hold off on this until a better time for work occurs even though it may be the fall ? ? ?ROS ? ? ?   ?Objective:  ?Physical Exam  ?Neurovascular status intact severe collapse medial longitudinal arch left with probable nonfunction of the posterior tibial tendon inflammation of the sinus tarsi left with fluid buildup within the joint surface and instability in general of the foot and ankle ? ?   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 ?   ?Plan:  ?H&P numerous view x-rays taken today sterile prep injected sinus tarsi 3 mg Kenalog 5 mg Xylocaine and casted for AFO brace to try to lift up the arch and take some of the stress off what she is experiencing.  Understands that this is a difficult issue ultimately require MRI CT scan and strong probability for long-term fusion of the subtalar joint and possible other joints ? ?X-rays indicate significant arthritis complete collapse medial longitudinal arch with subtalar joint arthritis significant in its nature on conventional x-rays ?   ? ? ?

## 2022-04-02 ENCOUNTER — Encounter: Payer: Self-pay | Admitting: Podiatry

## 2022-04-02 ENCOUNTER — Ambulatory Visit (INDEPENDENT_AMBULATORY_CARE_PROVIDER_SITE_OTHER): Payer: BC Managed Care – PPO | Admitting: Podiatry

## 2022-04-02 DIAGNOSIS — M76822 Posterior tibial tendinitis, left leg: Secondary | ICD-10-CM | POA: Diagnosis not present

## 2022-04-02 DIAGNOSIS — M7752 Other enthesopathy of left foot: Secondary | ICD-10-CM

## 2022-04-02 MED ORDER — TRIAMCINOLONE ACETONIDE 10 MG/ML IJ SUSP
10.0000 mg | Freq: Once | INTRAMUSCULAR | Status: AC
  Administered 2022-04-02: 10 mg

## 2022-04-02 NOTE — Progress Notes (Signed)
Subjective:   Patient ID: Bridget Perez, female   DOB: 71 y.o.   MRN: [id number]   HPI Patient has developed more pain in the inside of the left ankle knows that she is getting need surgery is still trying to figure out the right timing and is getting ready to go on a big trip in the middle of July   ROS      Objective:  Physical Exam  Neurovascular status intact pain at the posterior tibial tendon insertion navicular left with tremendous stress on the inside of the ankle secondary to the structural position of the bone with mild discomfort of the sinus tarsi     Assessment:  Severe arthritis subtalar joint left and malalignment with collapse medial longitudinal arch stress on the posterior tibial tendon with probable nonfunction with inflammation and sinus tarsitis     Plan:  Discussed the surgery she knows she needs to get it done again is looking at timing and when will be best.  At this point I did go ahead I did a careful injection of the medial posterior tib near its insertion navicular advised on reduced activity support therapy and I want to see her back the week before her trip to see if there is anything else I need to do to try to help

## 2022-04-23 ENCOUNTER — Ambulatory Visit (INDEPENDENT_AMBULATORY_CARE_PROVIDER_SITE_OTHER): Payer: BC Managed Care – PPO | Admitting: Podiatry

## 2022-04-23 ENCOUNTER — Encounter: Payer: Self-pay | Admitting: Podiatry

## 2022-04-23 DIAGNOSIS — M7752 Other enthesopathy of left foot: Secondary | ICD-10-CM

## 2022-04-23 DIAGNOSIS — M76822 Posterior tibial tendinitis, left leg: Secondary | ICD-10-CM

## 2022-04-23 MED ORDER — TRIAMCINOLONE ACETONIDE 10 MG/ML IJ SUSP
10.0000 mg | Freq: Once | INTRAMUSCULAR | Status: AC
  Administered 2022-04-23: 10 mg

## 2022-04-23 NOTE — Progress Notes (Signed)
Subjective:   Patient ID: Bridget Perez, female   DOB: 71 y.o.   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  Reappoint to recheck

## 2022-05-15 ENCOUNTER — Ambulatory Visit: Payer: Medicare Other | Admitting: Podiatry

## 2022-05-16 ENCOUNTER — Ambulatory Visit: Payer: BC Managed Care – PPO | Admitting: Podiatry

## 2022-05-16 ENCOUNTER — Encounter: Payer: Self-pay | Admitting: Podiatry

## 2022-05-16 DIAGNOSIS — M7752 Other enthesopathy of left foot: Secondary | ICD-10-CM | POA: Diagnosis not present

## 2022-05-16 MED ORDER — TRIAMCINOLONE ACETONIDE 10 MG/ML IJ SUSP
10.0000 mg | Freq: Once | INTRAMUSCULAR | Status: AC
  Administered 2022-05-16: 10 mg

## 2022-05-18 NOTE — Progress Notes (Signed)
Subjective:   Patient ID: Bridget Perez, female   DOB: 71 y.o.   MRN: [id number]   HPI Patient is getting ready to go to [place] and has pain that is in a different area than what we treated last time but sore   ROS      Objective:  Physical Exam  Neurovascular status intact with patient more into the lateral ankle gutter with the sinus tarsi doing well     Assessment:  Severe foot structural issues with patient is going to need extensive foot reconstruction but will try to help her short-term with inflammation of the lateral subtalar joint     Plan:  Reviewed condition sterile prep injected the lateral ankle 3 mg Kenalog 5 mg Xylocaine advised on supportive shoes reappoint to recheck

## 2022-05-29 ENCOUNTER — Telehealth: Payer: Self-pay | Admitting: Podiatry

## 2022-05-29 NOTE — Telephone Encounter (Signed)
Spoke  with patient she will call back Monday to schedule time to pick up brace she is in [place].

## 2022-07-04 ENCOUNTER — Other Ambulatory Visit: Payer: BC Managed Care – PPO

## 2022-07-04 ENCOUNTER — Ambulatory Visit (INDEPENDENT_AMBULATORY_CARE_PROVIDER_SITE_OTHER): Payer: BC Managed Care – PPO | Admitting: *Deleted

## 2022-07-04 DIAGNOSIS — M76822 Posterior tibial tendinitis, left leg: Secondary | ICD-10-CM | POA: Diagnosis not present

## 2022-07-04 DIAGNOSIS — M19072 Primary osteoarthritis, left ankle and foot: Secondary | ICD-10-CM

## 2022-07-04 NOTE — Progress Notes (Signed)
Patient presents today to pick up a custom AFO brace for the left foot.  Dispensed to the patient was:  AFO [place] Brace - Articulated  Color: Black  Closure: Laces  Hinge: [place]   Fit was satisfactory. Instructions for break-in and wear was reviewed and a copy was given to the patient.   Re-appointment as needed or if there should be any trouble with the brace.

## 2022-07-15 NOTE — Progress Notes (Signed)
Sent message, via epic in basket, requesting orders in epic from surgeon.  

## 2022-07-17 ENCOUNTER — Ambulatory Visit: Payer: Self-pay | Admitting: Physician Assistant

## 2022-07-17 DIAGNOSIS — G8929 Other chronic pain: Secondary | ICD-10-CM

## 2022-07-17 NOTE — H&P (Signed)
TOTAL KNEE ADMISSION H&P  Patient is being admitted for right total knee arthroplasty.  Subjective:  Chief Complaint:right knee pain.  HPI: Bridget Perez, 71 y.o. female, has a history of pain and functional disability in the right knee due to arthritis and has failed non-surgical conservative treatments for greater than 12 weeks to includeNSAID's and/or analgesics, corticosteriod injections, viscosupplementation injections, use of assistive devices, and activity modification.  Onset of symptoms was gradual, starting >10 years ago with gradually worsening course since that time. The patient noted no past surgery on the right knee(s).  Patient currently rates pain in the right knee(s) at 8 out of 10 with activity. Patient has night pain, worsening of pain with activity and weight bearing, pain that interferes with activities of daily living, pain with passive range of motion, crepitus, and joint swelling.  Patient has evidence of periarticular osteophytes and joint space narrowing by imaging studies. There is no active infection.  Patient Active Problem List   Diagnosis Date Noted   Bilateral carpal tunnel syndrome 09/23/2017   Pain 09/23/2017   Primary osteoarthritis of both first carpometacarpal joints 09/23/2017   Past Medical History:  Diagnosis Date   Arthritis    GERD (gastroesophageal reflux disease)     Past Surgical History:  Procedure Laterality Date   ABDOMINAL HYSTERECTOMY     CARPAL TUNNEL RELEASE Right 10/08/2017   Procedure: RIGHT CARPAL TUNNEL RELEASE;  Surgeon: Cindee Salt, MD;  Location: [facility];  Service: Orthopedics;  Laterality: Right;   CYSTECTOMY      Current Outpatient Medications  Medication Sig Dispense Refill Last Dose   amLODipine (NORVASC) 5 MG tablet Take 5 mg by mouth daily.      Azilsartan-Chlorthalidone (EDARBYCLOR) 40-25 MG TABS Take 1 tablet by mouth daily.      Coenzyme Q10 (CO Q10 PO) Take 1 capsule by mouth daily.       diclofenac Sodium (VOLTAREN) 1 % GEL Apply 2 g topically 2 (two) times daily as needed (pain).      Ginger, Zingiber officinalis, (GINGER PO) Take 1 tablet by mouth daily.      Multiple Vitamin (MULTIVITAMIN WITH MINERALS) TABS tablet Take 1 tablet by mouth daily.      TURMERIC PO Take 1 capsule by mouth daily.      No current facility-administered medications for this visit.   No Known Allergies  Social History   Tobacco Use   Smoking status: Never   Smokeless tobacco: Never  Substance Use Topics   Alcohol use: Yes    Comment: social    No family history on file.   Review of Systems  Musculoskeletal:  Positive for arthralgias.  All other systems reviewed and are negative.   Objective:  Physical Exam Constitutional:      General: She is not in acute distress.    Appearance: Normal appearance.  HENT:     Head: Normocephalic and atraumatic.  Eyes:     Extraocular Movements: Extraocular movements intact.     Pupils: Pupils are equal, round, and reactive to light.  Cardiovascular:     Rate and Rhythm: Normal rate and regular rhythm.     Pulses: Normal pulses.     Heart sounds: Normal heart sounds.  Pulmonary:     Effort: Pulmonary effort is normal. No respiratory distress.     Breath sounds: Normal breath sounds.  Abdominal:     General: Abdomen is flat. Bowel sounds are normal. There is no distension.  Palpations: Abdomen is soft.     Tenderness: There is no abdominal tenderness.  Musculoskeletal:     Cervical back: Normal range of motion and neck supple.     Comments: Examination of the right lower extremity again shows she is neurovascularly intact.  Intact dorsiflexion and plantarflexion with the ankle.  There is no calf tenderness to palpation.  She has good flexion and extension with the knee.  Stable to varus and valgus.    Lymphadenopathy:     Cervical: No cervical adenopathy.  Skin:    General: Skin is warm and dry.     Findings: No erythema or rash.   Neurological:     General: No focal deficit present.     Mental Status: She is alert and oriented to person, place, and time.  Psychiatric:        Mood and Affect: Mood normal.        Behavior: Behavior normal.     Vital signs in last 24 hours: @VSRANGES @  Labs:   Estimated body mass index is 29.76 kg/m as calculated from the following:   Height as of 04/20/21: 5\' 3"  (1.6 m).   Weight as of 04/20/21: 76.2 kg.   Imaging Review Plain radiographs demonstrate moderate degenerative joint disease of the right knee(s). The overall alignment issignificant varus. The bone quality appears to be good for age and reported activity level.      Assessment/Plan:  End stage arthritis, right knee   The patient history, physical examination, clinical judgment of the provider and imaging studies are consistent with end stage degenerative joint disease of the right knee(s) and total knee arthroplasty is deemed medically necessary. The treatment options including medical management, injection therapy arthroscopy and arthroplasty were discussed at length. The risks and benefits of total knee arthroplasty were presented and reviewed. The risks due to aseptic loosening, infection, stiffness, patella tracking problems, thromboembolic complications and other imponderables were discussed. The patient acknowledged the explanation, agreed to proceed with the plan and consent was signed. Patient is being admitted for inpatient treatment for surgery, pain control, PT, OT, prophylactic antibiotics, VTE prophylaxis, progressive ambulation and ADL's and discharge planning. The patient is planning to be discharged  home with outpt PT.     Patient's anticipated LOS is less than 2 midnights, meeting these requirements: - Younger than 2 - Lives within 1 hour of care - Has a competent adult at home to recover with post-op recover - NO history of  - Chronic pain requiring opiods  - Diabetes  - Coronary Artery  Disease  - Heart failure  - Heart attack  - Stroke  - DVT/VTE  - Cardiac arrhythmia  - Respiratory Failure/COPD  - Renal failure  - Anemia  - Advanced Liver disease

## 2022-07-17 NOTE — H&P (View-Only) (Signed)
TOTAL KNEE ADMISSION H&P  Patient is being admitted for right total knee arthroplasty.  Subjective:  Chief Complaint:right knee pain.  HPI: Bridget Perez, 71 y.o. female, has a history of pain and functional disability in the right knee due to arthritis and has failed non-surgical conservative treatments for greater than 12 weeks to includeNSAID's and/or analgesics, corticosteriod injections, viscosupplementation injections, use of assistive devices, and activity modification.  Onset of symptoms was gradual, starting >10 years ago with gradually worsening course since that time. The patient noted no past surgery on the right knee(s).  Patient currently rates pain in the right knee(s) at 8 out of 10 with activity. Patient has night pain, worsening of pain with activity and weight bearing, pain that interferes with activities of daily living, pain with passive range of motion, crepitus, and joint swelling.  Patient has evidence of periarticular osteophytes and joint space narrowing by imaging studies. There is no active infection.  Patient Active Problem List   Diagnosis Date Noted   Bilateral carpal tunnel syndrome 09/23/2017   Pain 09/23/2017   Primary osteoarthritis of both first carpometacarpal joints 09/23/2017   Past Medical History:  Diagnosis Date   Arthritis    GERD (gastroesophageal reflux disease)     Past Surgical History:  Procedure Laterality Date   ABDOMINAL HYSTERECTOMY     CARPAL TUNNEL RELEASE Right 10/08/2017   Procedure: RIGHT CARPAL TUNNEL RELEASE;  Surgeon: Kuzma, Gary, MD;  Location: [facility];  Service: Orthopedics;  Laterality: Right;   CYSTECTOMY      Current Outpatient Medications  Medication Sig Dispense Refill Last Dose   amLODipine (NORVASC) 5 MG tablet Take 5 mg by mouth daily.      Azilsartan-Chlorthalidone (EDARBYCLOR) 40-25 MG TABS Take 1 tablet by mouth daily.      Coenzyme Q10 (CO Q10 PO) Take 1 capsule by mouth daily.       diclofenac Sodium (VOLTAREN) 1 % GEL Apply 2 g topically 2 (two) times daily as needed (pain).      Ginger, Zingiber officinalis, (GINGER PO) Take 1 tablet by mouth daily.      Multiple Vitamin (MULTIVITAMIN WITH MINERALS) TABS tablet Take 1 tablet by mouth daily.      TURMERIC PO Take 1 capsule by mouth daily.      No current facility-administered medications for this visit.   No Known Allergies  Social History   Tobacco Use   Smoking status: Never   Smokeless tobacco: Never  Substance Use Topics   Alcohol use: Yes    Comment: social    No family history on file.   Review of Systems  Musculoskeletal:  Positive for arthralgias.  All other systems reviewed and are negative.   Objective:  Physical Exam Constitutional:      General: She is not in acute distress.    Appearance: Normal appearance.  HENT:     Head: Normocephalic and atraumatic.  Eyes:     Extraocular Movements: Extraocular movements intact.     Pupils: Pupils are equal, round, and reactive to light.  Cardiovascular:     Rate and Rhythm: Normal rate and regular rhythm.     Pulses: Normal pulses.     Heart sounds: Normal heart sounds.  Pulmonary:     Effort: Pulmonary effort is normal. No respiratory distress.     Breath sounds: Normal breath sounds.  Abdominal:     General: Abdomen is flat. Bowel sounds are normal. There is no distension.       Palpations: Abdomen is soft.     Tenderness: There is no abdominal tenderness.  Musculoskeletal:     Cervical back: Normal range of motion and neck supple.     Comments: Examination of the right lower extremity again shows she is neurovascularly intact.  Intact dorsiflexion and plantarflexion with the ankle.  There is no calf tenderness to palpation.  She has good flexion and extension with the knee.  Stable to varus and valgus.    Lymphadenopathy:     Cervical: No cervical adenopathy.  Skin:    General: Skin is warm and dry.     Findings: No erythema or rash.   Neurological:     General: No focal deficit present.     Mental Status: She is alert and oriented to person, place, and time.  Psychiatric:        Mood and Affect: Mood normal.        Behavior: Behavior normal.     Vital signs in last 24 hours: @VSRANGES @  Labs:   Estimated body mass index is 29.76 kg/m as calculated from the following:   Height as of 04/20/21: 5\' 3"  (1.6 m).   Weight as of 04/20/21: 76.2 kg.   Imaging Review Plain radiographs demonstrate moderate degenerative joint disease of the right knee(s). The overall alignment issignificant varus. The bone quality appears to be good for age and reported activity level.      Assessment/Plan:  End stage arthritis, right knee   The patient history, physical examination, clinical judgment of the provider and imaging studies are consistent with end stage degenerative joint disease of the right knee(s) and total knee arthroplasty is deemed medically necessary. The treatment options including medical management, injection therapy arthroscopy and arthroplasty were discussed at length. The risks and benefits of total knee arthroplasty were presented and reviewed. The risks due to aseptic loosening, infection, stiffness, patella tracking problems, thromboembolic complications and other imponderables were discussed. The patient acknowledged the explanation, agreed to proceed with the plan and consent was signed. Patient is being admitted for inpatient treatment for surgery, pain control, PT, OT, prophylactic antibiotics, VTE prophylaxis, progressive ambulation and ADL's and discharge planning. The patient is planning to be discharged  home with outpt PT.     Patient's anticipated LOS is less than 2 midnights, meeting these requirements: - Younger than 2 - Lives within 1 hour of care - Has a competent adult at home to recover with post-op recover - NO history of  - Chronic pain requiring opiods  - Diabetes  - Coronary Artery  Disease  - Heart failure  - Heart attack  - Stroke  - DVT/VTE  - Cardiac arrhythmia  - Respiratory Failure/COPD  - Renal failure  - Anemia  - Advanced Liver disease

## 2022-07-18 NOTE — Patient Instructions (Signed)
DUE TO COVID-19 ONLY TWO VISITORS  (aged 70 and older)  ARE ALLOWED TO COME WITH YOU AND STAY IN THE WAITING ROOM ONLY DURING PRE OP AND PROCEDURE.   **NO VISITORS ARE ALLOWED IN THE SHORT STAY AREA OR RECOVERY ROOM!!**  IF YOU WILL BE ADMITTED INTO THE HOSPITAL YOU ARE ALLOWED ONLY FOUR SUPPORT PEOPLE DURING VISITATION HOURS ONLY (7 AM -8PM)   The support person(s) must pass our screening, gel in and out, and wear a mask at all times, including in the patient's room. Patients must also wear a mask when staff or their support person are in the room. Visitors GUEST BADGE MUST BE WORN VISIBLY  One adult visitor may remain with you overnight and MUST be in the room by 8 P.M.     Your procedure is scheduled on: 08/01/22   Report to [facility] Main Entrance    Report to admitting at : 5:15 AM   Call this number if you have problems the morning of surgery [phone]   Do not eat food :After Midnight.   After Midnight you may have the following liquids until : 4:30 AM DAY OF SURGERY  Water Black Coffee (sugar ok, NO MILK/CREAM OR CREAMERS)  Tea (sugar ok, NO MILK/CREAM OR CREAMERS) regular and decaf                             Plain Jell-O (NO RED)                                           Fruit ices (not with fruit pulp, NO RED)                                     Popsicles (NO RED)                                                                  Juice: apple, WHITE grape, WHITE cranberry Sports drinks like Gatorade (NO RED)              Drink  Ensure drink AT: 4:30 AM the day of surgery.     The day of surgery:  Drink ONE (1) Pre-Surgery Clear Ensure or G2 at AM the morning of surgery. Drink in one sitting. Do not sip.  This drink was given to you during your hospital  pre-op appointment visit. Nothing else to drink after completing the  Pre-Surgery Clear Ensure or G2.          If you have questions, please contact your surgeon's office.    Oral Hygiene is also  important to reduce your risk of infection.                                    Remember - BRUSH YOUR TEETH THE MORNING OF SURGERY WITH YOUR REGULAR TOOTHPASTE   Do NOT smoke after Midnight   Take these medicines the morning of surgery with A  SIP OF WATER: amlodipine.  DO NOT TAKE ANY ORAL DIABETIC MEDICATIONS DAY OF YOUR SURGERY  Bring CPAP mask and tubing day of surgery.                              You may not have any metal on your body including hair pins, jewelry, and body piercing             Do not wear make-up, lotions, powders, perfumes/cologne, or deodorant  Do not wear nail polish including gel and S&S, artificial/acrylic nails, or any other type of covering on natural nails including finger and toenails. If you have artificial nails, gel coating, etc. that needs to be removed by a nail salon please have this removed prior to surgery or surgery may need to be canceled/ delayed if the surgeon/ anesthesia feels like they are unable to be safely monitored.   Do not shave  48 hours prior to surgery.    Do not bring valuables to the hospital. [facility] IS NOT             RESPONSIBLE   FOR VALUABLES.   Contacts, dentures or bridgework may not be worn into surgery.   Bring small overnight bag day of surgery.   DO NOT BRING YOUR HOME MEDICATIONS TO THE HOSPITAL. PHARMACY WILL DISPENSE MEDICATIONS LISTED ON YOUR MEDICATION LIST TO YOU DURING YOUR ADMISSION IN THE HOSPITAL!    Patients discharged on the day of surgery will not be allowed to drive home.  Someone NEEDS to stay with you for the first 24 hours after anesthesia.   Special Instructions: Bring a copy of your healthcare power of attorney and living will documents         the day of surgery if you haven't scanned them before.              Please read over the following fact sheets you were given: IF YOU HAVE QUESTIONS ABOUT YOUR PRE-OP INSTRUCTIONS PLEASE CALL [phone]     [facility] - Preparing for Surgery Before  surgery, you can play an important role.  Because skin is not sterile, your skin needs to be as free of germs as possible.  You can reduce the number of germs on your skin by washing with CHG (chlorahexidine gluconate) soap before surgery.  CHG is an antiseptic cleaner which kills germs and bonds with the skin to continue killing germs even after washing. Please DO NOT use if you have an allergy to CHG or antibacterial soaps.  If your skin becomes reddened/irritated stop using the CHG and inform your nurse when you arrive at Short Stay. Do not shave (including legs and underarms) for at least 48 hours prior to the first CHG shower.  You may shave your face/neck. Please follow these instructions carefully:  1.  Shower with CHG Soap the night before surgery and the  morning of Surgery.  2.  If you choose to wash your hair, wash your hair first as usual with your  normal  shampoo.  3.  After you shampoo, rinse your hair and body thoroughly to remove the  shampoo.                           4.  Use CHG as you would any other liquid soap.  You can apply chg directly  to the skin and wash  Gently with a scrungie or clean washcloth.  5.  Apply the CHG Soap to your body ONLY FROM THE NECK DOWN.   Do not use on face/ open                           Wound or open sores. Avoid contact with eyes, ears mouth and genitals (private parts).                       Wash face,  Genitals (private parts) with your normal soap.             6.  Wash thoroughly, paying special attention to the area where your surgery  will be performed.  7.  Thoroughly rinse your body with warm water from the neck down.  8.  DO NOT shower/wash with your normal soap after using and rinsing off  the CHG Soap.                9.  Pat yourself dry with a clean towel.            10.  Wear clean pajamas.            11.  Place clean sheets on your bed the night of your first shower and do not  sleep with pets. Day of Surgery  : Do not apply any lotions/deodorants the morning of surgery.  Please wear clean clothes to the hospital/surgery center.  FAILURE TO FOLLOW THESE INSTRUCTIONS MAY RESULT IN THE CANCELLATION OF YOUR SURGERY PATIENT SIGNATURE_________________________________  NURSE SIGNATURE__________________________________  ________________________________________________________________________   Adam Phenix  An incentive spirometer is a tool that can help keep your lungs clear and active. This tool measures how well you are filling your lungs with each breath. Taking long deep breaths may help reverse or decrease the chance of developing breathing (pulmonary) problems (especially infection) following: A long period of time when you are unable to move or be active. BEFORE THE PROCEDURE  If the spirometer includes an indicator to show your best effort, your nurse or respiratory therapist will set it to a desired goal. If possible, sit up straight or lean slightly forward. Try not to slouch. Hold the incentive spirometer in an upright position. INSTRUCTIONS FOR USE  Sit on the edge of your bed if possible, or sit up as far as you can in bed or on a chair. Hold the incentive spirometer in an upright position. Breathe out normally. Place the mouthpiece in your mouth and seal your lips tightly around it. Breathe in slowly and as deeply as possible, raising the piston or the ball toward the top of the column. Hold your breath for 3-5 seconds or for as long as possible. Allow the piston or ball to fall to the bottom of the column. Remove the mouthpiece from your mouth and breathe out normally. Rest for a few seconds and repeat Steps 1 through 7 at least 10 times every 1-2 hours when you are awake. Take your time and take a few normal breaths between deep breaths. The spirometer may include an indicator to show your best effort. Use the indicator as a goal to work toward during each repetition. After  each set of 10 deep breaths, practice coughing to be sure your lungs are clear. If you have an incision (the cut made at the time of surgery), support your incision when coughing by placing a pillow or rolled up towels  firmly against it. Once you are able to get out of bed, walk around indoors and cough well. You may stop using the incentive spirometer when instructed by your caregiver.  RISKS AND COMPLICATIONS Take your time so you do not get dizzy or light-headed. If you are in pain, you may need to take or ask for pain medication before doing incentive spirometry. It is harder to take a deep breath if you are having pain. AFTER USE Rest and breathe slowly and easily. It can be helpful to keep track of a log of your progress. Your caregiver can provide you with a simple table to help with this. If you are using the spirometer at home, follow these instructions: [place] IF:  You are having difficultly using the spirometer. You have trouble using the spirometer as often as instructed. Your pain medication is not giving enough relief while using the spirometer. You develop fever of 100.5 F (38.1 C) or higher. SEEK IMMEDIATE MEDICAL CARE IF:  You cough up bloody sputum that had not been present before. You develop fever of 102 F (38.9 C) or greater. You develop worsening pain at or near the incision site. MAKE SURE YOU:  Understand these instructions. Will watch your condition. Will get help right away if you are not doing well or get worse. Document Released: 03/02/2007 Document Revised: 01/12/2012 Document Reviewed: 05/03/2007 [facility] Patient Information 2014 [place], [place].   ________________________________________________________________________

## 2022-07-21 ENCOUNTER — Encounter (HOSPITAL_COMMUNITY)
Admission: RE | Admit: 2022-07-21 | Discharge: 2022-07-21 | Disposition: A | Discharge status: Home / Self Care | Payer: BC Managed Care – PPO | Source: Ambulatory Visit | Attending: Orthopedic Surgery | Admitting: Orthopedic Surgery

## 2022-07-21 ENCOUNTER — Encounter (HOSPITAL_COMMUNITY): Payer: Self-pay

## 2022-07-21 ENCOUNTER — Other Ambulatory Visit: Payer: Self-pay

## 2022-07-21 DIAGNOSIS — I2510 Atherosclerotic heart disease of native coronary artery without angina pectoris: Secondary | ICD-10-CM

## 2022-07-21 DIAGNOSIS — Z01818 Encounter for other preprocedural examination: Secondary | ICD-10-CM | POA: Diagnosis present

## 2022-07-21 HISTORY — DX: Essential (primary) hypertension: I10

## 2022-07-21 NOTE — Progress Notes (Signed)
For Short Stay: [place] appointment date: Date of COVID positive in last 63 days:  Bowel Prep reminder:   For Anesthesia: PCP - Dr. Iona Beard. Cardiologist - N/A  Chest x-ray -  EKG -  Stress Test -  ECHO -  Cardiac Cath -  Pacemaker/ICD device last checked: Pacemaker orders received: Device Rep notified:  Spinal Cord Stimulator:  Sleep Study -  CPAP -   Fasting Blood Sugar -  Checks Blood Sugar _____ times a day Date and result of last Hgb A1c-  Blood Thinner Instructions: Aspirin Instructions: Last Dose:  Activity level: Can go up a flight of stairs and activities of daily living without stopping and without chest pain and/or shortness of breath   Able to exercise without chest pain and/or shortness of breath   Unable to go up a flight of stairs without chest pain and/or shortness of breath     Anesthesia review: Hx: HTN  Patient denies shortness of breath, fever, cough and chest pain at PAT appointment   Patient verbalized understanding of instructions that were given to them at the PAT appointment. Patient was also instructed that they will need to review over the PAT instructions again at home before surgery.

## 2022-07-22 ENCOUNTER — Telehealth: Payer: Self-pay | Admitting: Podiatry

## 2022-07-22 NOTE — Telephone Encounter (Signed)
Pt scheduled to see Dr Paulla Dolly on 9.22.2023 @ 745

## 2022-07-22 NOTE — Telephone Encounter (Signed)
Dr. Criss Rosales called & needs to come & be seen. What day & time would you like for her to come in. Please advise

## 2022-07-24 ENCOUNTER — Encounter (HOSPITAL_COMMUNITY)
Admission: RE | Admit: 2022-07-24 | Discharge: 2022-07-24 | Disposition: A | Discharge status: Home / Self Care | Payer: BC Managed Care – PPO | Source: Ambulatory Visit | Attending: Orthopedic Surgery | Admitting: Orthopedic Surgery

## 2022-07-24 DIAGNOSIS — I2510 Atherosclerotic heart disease of native coronary artery without angina pectoris: Secondary | ICD-10-CM | POA: Diagnosis not present

## 2022-07-24 DIAGNOSIS — M25561 Pain in right knee: Secondary | ICD-10-CM | POA: Insufficient documentation

## 2022-07-24 DIAGNOSIS — Z01818 Encounter for other preprocedural examination: Secondary | ICD-10-CM | POA: Insufficient documentation

## 2022-07-24 DIAGNOSIS — G8929 Other chronic pain: Secondary | ICD-10-CM | POA: Insufficient documentation

## 2022-07-24 LAB — CBC WITH DIFFERENTIAL/PLATELET
Abs Immature Granulocytes: 0.01 10*3/uL (ref 0.00–0.07)
Basophils Absolute: 0 10*3/uL (ref 0.0–0.1)
Basophils Relative: 1 %
Eosinophils Absolute: 0.1 10*3/uL (ref 0.0–0.5)
Eosinophils Relative: 2 %
HCT: 42.1 % (ref 36.0–46.0)
Hemoglobin: 13 g/dL (ref 12.0–15.0)
Immature Granulocytes: 0 %
Lymphocytes Relative: 27 %
Lymphs Abs: 1.1 10*3/uL (ref 0.7–4.0)
MCH: 26.2 pg (ref 26.0–34.0)
MCHC: 30.9 g/dL (ref 30.0–36.0)
MCV: 84.9 fL (ref 80.0–100.0)
Monocytes Absolute: 0.4 10*3/uL (ref 0.1–1.0)
Monocytes Relative: 10 %
Neutro Abs: 2.5 10*3/uL (ref 1.7–7.7)
Neutrophils Relative %: 60 %
Platelets: 190 10*3/uL (ref 150–400)
RBC: 4.96 MIL/uL (ref 3.87–5.11)
RDW: 15.3 % (ref 11.5–15.5)
WBC: 4.2 10*3/uL (ref 4.0–10.5)
nRBC: 0 % (ref 0.0–0.2)

## 2022-07-24 LAB — COMPREHENSIVE METABOLIC PANEL
ALT: 20 U/L (ref 0–44)
AST: 21 U/L (ref 15–41)
Albumin: 3.5 g/dL (ref 3.5–5.0)
Alkaline Phosphatase: 64 U/L (ref 38–126)
Anion gap: 6 (ref 5–15)
BUN: 16 mg/dL (ref 8–23)
CO2: 27 mmol/L (ref 22–32)
Calcium: 9.4 mg/dL (ref 8.9–10.3)
Chloride: 105 mmol/L (ref 98–111)
Creatinine, Ser: 0.58 mg/dL (ref 0.44–1.00)
GFR, Estimated: >60 mL/min (ref >60)
Glucose, Bld: 95 mg/dL (ref 70–99)
Potassium: 3.9 mmol/L (ref 3.5–5.1)
Sodium: 138 mmol/L (ref 135–145)
Total Bilirubin: 1.1 mg/dL (ref 0.3–1.2)
Total Protein: 6.7 g/dL (ref 6.5–8.1)

## 2022-07-24 LAB — SURGICAL PCR SCREEN
MRSA, PCR: NEGATIVE
Staphylococcus aureus: NEGATIVE

## 2022-07-25 ENCOUNTER — Encounter: Payer: Self-pay | Admitting: Podiatry

## 2022-07-25 ENCOUNTER — Ambulatory Visit: Payer: BC Managed Care – PPO | Admitting: Podiatry

## 2022-07-25 DIAGNOSIS — M19072 Primary osteoarthritis, left ankle and foot: Secondary | ICD-10-CM | POA: Diagnosis not present

## 2022-07-25 DIAGNOSIS — M76822 Posterior tibial tendinitis, left leg: Secondary | ICD-10-CM | POA: Diagnosis not present

## 2022-07-25 NOTE — Progress Notes (Unsigned)
6 

## 2022-07-27 NOTE — Progress Notes (Signed)
Subjective:   Patient ID: Bridget Perez, female   DOB: 71 y.o.   MRN: [id number]   HPI Patient presents stating that she is having knee replacement right in 1 week.  She would like to have surgery in December on her left ankle.  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  I am going to present with this the group and Dr. Posey Pronto did evaluate her with me today and were both in agreement it will be a challenge getting her talus relocated and we will have to decide whether or not we will get a be able to get this done prior to the end of the year with her particular schedule coming.  Results of CT should help [place] from that perspective  X-rays today did indicate severe collapse medial longitudinal arch not changing from previous visit

## 2022-07-29 ENCOUNTER — Ambulatory Visit
Admission: RE | Admit: 2022-07-29 | Discharge: 2022-07-29 | Disposition: A | Discharge status: Home / Self Care | Payer: BC Managed Care – PPO | Source: Ambulatory Visit | Attending: Podiatry | Admitting: Podiatry

## 2022-07-29 DIAGNOSIS — M76822 Posterior tibial tendinitis, left leg: Secondary | ICD-10-CM

## 2022-07-31 MED ORDER — TRANEXAMIC ACID 1000 MG/10ML IV SOLN
2000.0000 mg | INTRAVENOUS | Status: DC
  Filled 2022-07-31: qty 20

## 2022-07-31 NOTE — Progress Notes (Signed)
This is physician I'm friends with. I'd like for [place] to take care of if possible. We can talk about this case

## 2022-07-31 NOTE — Anesthesia Preprocedure Evaluation (Addendum)
Anesthesia Evaluation  Patient identified by MRN, date of birth, ID band Patient awake    Reviewed: Allergy & Precautions, NPO status , Patient's Chart, lab work & pertinent test results  Airway Mallampati: II  TM Distance: >3 FB Neck ROM: Full    Dental  (+) Teeth Intact, Dental Advisory Given   Pulmonary neg pulmonary ROS,    Pulmonary exam normal breath sounds clear to auscultation       Cardiovascular hypertension, Pt. on medications Normal cardiovascular exam Rhythm:Regular Rate:Normal     Neuro/Psych negative neurological ROS     GI/Hepatic Neg liver ROS, GERD  ,  Endo/Other  negative endocrine ROS  Renal/GU negative Renal ROS     Musculoskeletal  (+) Arthritis , Osteoarthritis,    Abdominal   Peds  Hematology negative hematology ROS (+) Plt 190k   Anesthesia Other Findings   Reproductive/Obstetrics                            Anesthesia Physical Anesthesia Plan  ASA: 2  Anesthesia Plan: Spinal   Post-op Pain Management: Regional block* and Tylenol PO (pre-op)*   Induction: Intravenous  PONV Risk Score and Plan: 2 and Propofol infusion, Dexamethasone and Ondansetron  Airway Management Planned: Natural Airway and Simple Face Mask  Additional Equipment:   Intra-op Plan:   Post-operative Plan:   Informed Consent: I have reviewed the patients History and Physical, chart, labs and discussed the procedure including the risks, benefits and alternatives for the proposed anesthesia with the patient or authorized representative who has indicated his/her understanding and acceptance.     Dental advisory given  Plan Discussed with: CRNA  Anesthesia Plan Comments:        Anesthesia Quick Evaluation

## 2022-08-01 ENCOUNTER — Encounter (HOSPITAL_COMMUNITY)
Admission: RE | Disposition: A | Discharge status: Home / Self Care | Payer: Self-pay | Source: Ambulatory Visit | Attending: Orthopedic Surgery

## 2022-08-01 ENCOUNTER — Ambulatory Visit (HOSPITAL_COMMUNITY): Payer: BC Managed Care – PPO | Admitting: Physician Assistant

## 2022-08-01 ENCOUNTER — Other Ambulatory Visit: Payer: Self-pay

## 2022-08-01 ENCOUNTER — Ambulatory Visit (HOSPITAL_COMMUNITY)
Admission: RE | Admit: 2022-08-01 | Discharge: 2022-08-01 | Disposition: A | Discharge status: Home / Self Care | Payer: BC Managed Care – PPO | Source: Ambulatory Visit | Attending: Orthopedic Surgery | Admitting: Orthopedic Surgery

## 2022-08-01 ENCOUNTER — Encounter (HOSPITAL_COMMUNITY): Payer: Self-pay | Admitting: Orthopedic Surgery

## 2022-08-01 ENCOUNTER — Ambulatory Visit (HOSPITAL_COMMUNITY): Payer: BC Managed Care – PPO | Admitting: Certified Registered"

## 2022-08-01 DIAGNOSIS — M2550 Pain in unspecified joint: Secondary | ICD-10-CM | POA: Insufficient documentation

## 2022-08-01 DIAGNOSIS — I10 Essential (primary) hypertension: Secondary | ICD-10-CM | POA: Insufficient documentation

## 2022-08-01 DIAGNOSIS — M1711 Unilateral primary osteoarthritis, right knee: Secondary | ICD-10-CM | POA: Diagnosis not present

## 2022-08-01 DIAGNOSIS — K219 Gastro-esophageal reflux disease without esophagitis: Secondary | ICD-10-CM | POA: Diagnosis not present

## 2022-08-01 DIAGNOSIS — M24561 Contracture, right knee: Secondary | ICD-10-CM | POA: Insufficient documentation

## 2022-08-01 DIAGNOSIS — M21161 Varus deformity, not elsewhere classified, right knee: Secondary | ICD-10-CM | POA: Insufficient documentation

## 2022-08-01 HISTORY — PX: TOTAL KNEE ARTHROPLASTY: SHX125

## 2022-08-01 LAB — TYPE AND SCREEN
ABO/RH(D): B POS
Antibody Screen: NEGATIVE

## 2022-08-01 LAB — ABO/RH: ABO/RH(D): B POS

## 2022-08-01 SURGERY — ARTHROPLASTY, KNEE, TOTAL
Anesthesia: Spinal | Site: Knee | Laterality: Right

## 2022-08-01 MED ORDER — BUPIVACAINE-EPINEPHRINE (PF) 0.25% -1:200000 IJ SOLN
INTRAMUSCULAR | Status: DC | PRN
  Administered 2022-08-01: 30 mL

## 2022-08-01 MED ORDER — DOCUSATE SODIUM 100 MG PO CAPS: 100.0000 mg | ORAL_CAPSULE | Freq: Every day | ORAL | 2 refills | Status: AC | PRN

## 2022-08-01 MED ORDER — ASPIRIN 81 MG PO TBEC: 81.0000 mg | DELAYED_RELEASE_TABLET | Freq: Two times a day (BID) | ORAL | 0 refills | Status: AC

## 2022-08-01 MED ORDER — 0.9 % SODIUM CHLORIDE (POUR BTL) OPTIME
TOPICAL | Status: DC | PRN
  Administered 2022-08-01: 1000 mL

## 2022-08-01 MED ORDER — ORAL CARE MOUTH RINSE: 15.0000 mL | Freq: Once | OROMUCOSAL | Status: AC

## 2022-08-01 MED ORDER — MIDAZOLAM HCL 2 MG/2ML IJ SOLN
INTRAMUSCULAR | Status: DC | PRN
  Administered 2022-08-01 (×2): 1 mg via INTRAVENOUS

## 2022-08-01 MED ORDER — FENTANYL CITRATE (PF) 100 MCG/2ML IJ SOLN
INTRAMUSCULAR | Status: DC | PRN
  Administered 2022-08-01: 50 ug via INTRAVENOUS

## 2022-08-01 MED ORDER — PHENYLEPHRINE HCL-NACL 20-0.9 MG/250ML-% IV SOLN
INTRAVENOUS | Status: DC | PRN
  Administered 2022-08-01: 40 ug/min via INTRAVENOUS

## 2022-08-01 MED ORDER — OXYCODONE HCL 5 MG PO TABS: 5.0000 mg | ORAL_TABLET | ORAL | Status: DC | PRN

## 2022-08-01 MED ORDER — ACETAMINOPHEN 325 MG PO TABS: 325.0000 mg | ORAL_TABLET | Freq: Four times a day (QID) | ORAL | Status: DC | PRN

## 2022-08-01 MED ORDER — SODIUM CHLORIDE 0.9 % IR SOLN
Status: DC | PRN
  Administered 2022-08-01: 1000 mL

## 2022-08-01 MED ORDER — FENTANYL CITRATE (PF) 100 MCG/2ML IJ SOLN
INTRAMUSCULAR | Status: AC
  Filled 2022-08-01: qty 2

## 2022-08-01 MED ORDER — TRAMADOL HCL 50 MG PO TABS: 50.0000 mg | ORAL_TABLET | ORAL | 0 refills | Status: DC | PRN

## 2022-08-01 MED ORDER — TRANEXAMIC ACID-NACL 1000-0.7 MG/100ML-% IV SOLN: 1000.0000 mg | Freq: Once | INTRAVENOUS | Status: DC

## 2022-08-01 MED ORDER — BUPIVACAINE LIPOSOME 1.3 % IJ SUSP
INTRAMUSCULAR | Status: DC | PRN
  Administered 2022-08-01: 20 mL

## 2022-08-01 MED ORDER — ACETAMINOPHEN 500 MG PO TABS: 1000.0000 mg | ORAL_TABLET | Freq: Once | ORAL | Status: DC

## 2022-08-01 MED ORDER — LACTATED RINGERS IV BOLUS
500.0000 mL | Freq: Once | INTRAVENOUS | Status: AC
  Administered 2022-08-01: 500 mL via INTRAVENOUS

## 2022-08-01 MED ORDER — ACETAMINOPHEN 500 MG PO TABS: 1000.0000 mg | ORAL_TABLET | Freq: Four times a day (QID) | ORAL | Status: DC

## 2022-08-01 MED ORDER — OXYCODONE HCL 5 MG PO TABS: ORAL_TABLET | ORAL | 0 refills | Status: DC

## 2022-08-01 MED ORDER — MIDAZOLAM HCL 2 MG/2ML IJ SOLN
INTRAMUSCULAR | Status: AC
  Filled 2022-08-01: qty 2

## 2022-08-01 MED ORDER — EPHEDRINE SULFATE-NACL 50-0.9 MG/10ML-% IV SOSY
PREFILLED_SYRINGE | INTRAVENOUS | Status: DC | PRN
  Administered 2022-08-01 (×2): 5 mg via INTRAVENOUS

## 2022-08-01 MED ORDER — HYDROMORPHONE HCL 1 MG/ML IJ SOLN: 0.5000 mg | INTRAMUSCULAR | Status: DC | PRN

## 2022-08-01 MED ORDER — ACETAMINOPHEN 500 MG PO TABS
1000.0000 mg | ORAL_TABLET | Freq: Once | ORAL | Status: AC
  Administered 2022-08-01: 1000 mg via ORAL
  Filled 2022-08-01: qty 2

## 2022-08-01 MED ORDER — TRAMADOL HCL 50 MG PO TABS: 50.0000 mg | ORAL_TABLET | Freq: Four times a day (QID) | ORAL | Status: DC

## 2022-08-01 MED ORDER — BUPIVACAINE LIPOSOME 1.3 % IJ SUSP
INTRAMUSCULAR | Status: AC
  Filled 2022-08-01: qty 20

## 2022-08-01 MED ORDER — DEXAMETHASONE SODIUM PHOSPHATE 10 MG/ML IJ SOLN
INTRAMUSCULAR | Status: DC | PRN
  Administered 2022-08-01: 4 mg via INTRAVENOUS

## 2022-08-01 MED ORDER — FENTANYL CITRATE PF 50 MCG/ML IJ SOSY: 25.0000 ug | PREFILLED_SYRINGE | INTRAMUSCULAR | Status: DC | PRN

## 2022-08-01 MED ORDER — METOCLOPRAMIDE HCL 5 MG/ML IJ SOLN: 5.0000 mg | Freq: Three times a day (TID) | INTRAMUSCULAR | Status: DC | PRN

## 2022-08-01 MED ORDER — WATER FOR IRRIGATION, STERILE IR SOLN
Status: DC | PRN
  Administered 2022-08-01: 2000 mL

## 2022-08-01 MED ORDER — ONDANSETRON HCL 4 MG/2ML IJ SOLN: 4.0000 mg | Freq: Once | INTRAMUSCULAR | Status: DC | PRN

## 2022-08-01 MED ORDER — CEFAZOLIN SODIUM-DEXTROSE 2-4 GM/100ML-% IV SOLN
2.0000 g | INTRAVENOUS | Status: AC
  Administered 2022-08-01: 2 g via INTRAVENOUS
  Filled 2022-08-01: qty 100

## 2022-08-01 MED ORDER — ONDANSETRON HCL 4 MG/2ML IJ SOLN
INTRAMUSCULAR | Status: DC | PRN
  Administered 2022-08-01: 4 mg via INTRAVENOUS

## 2022-08-01 MED ORDER — SODIUM CHLORIDE 0.9% FLUSH
INTRAVENOUS | Status: DC | PRN
  Administered 2022-08-01: 50 mL

## 2022-08-01 MED ORDER — ACETAMINOPHEN 500 MG PO TABS: 1000.0000 mg | ORAL_TABLET | Freq: Four times a day (QID) | ORAL | 2 refills | Status: AC | PRN

## 2022-08-01 MED ORDER — PROPOFOL 500 MG/50ML IV EMUL
INTRAVENOUS | Status: DC | PRN
  Administered 2022-08-01: 40 ug/kg/min via INTRAVENOUS

## 2022-08-01 MED ORDER — CHLORHEXIDINE GLUCONATE 0.12 % MT SOLN
15.0000 mL | Freq: Once | OROMUCOSAL | Status: AC
  Administered 2022-08-01: 15 mL via OROMUCOSAL

## 2022-08-01 MED ORDER — ONDANSETRON HCL 4 MG/2ML IJ SOLN: 4.0000 mg | Freq: Four times a day (QID) | INTRAMUSCULAR | Status: DC | PRN

## 2022-08-01 MED ORDER — DEXAMETHASONE SODIUM PHOSPHATE 10 MG/ML IJ SOLN
INTRAMUSCULAR | Status: AC
  Filled 2022-08-01: qty 1

## 2022-08-01 MED ORDER — ONDANSETRON HCL 4 MG/2ML IJ SOLN
INTRAMUSCULAR | Status: AC
  Filled 2022-08-01: qty 2

## 2022-08-01 MED ORDER — PROPOFOL 1000 MG/100ML IV EMUL
INTRAVENOUS | Status: AC
  Filled 2022-08-01: qty 100

## 2022-08-01 MED ORDER — LIDOCAINE 2% (20 MG/ML) 5 ML SYRINGE
INTRAMUSCULAR | Status: DC | PRN
  Administered 2022-08-01: 40 mg via INTRAVENOUS

## 2022-08-01 MED ORDER — TRANEXAMIC ACID 1000 MG/10ML IV SOLN
INTRAVENOUS | Status: DC | PRN
  Administered 2022-08-01: 2000 mg via TOPICAL

## 2022-08-01 MED ORDER — LACTATED RINGERS IV BOLUS
250.0000 mL | Freq: Once | INTRAVENOUS | Status: AC
  Administered 2022-08-01: 250 mL via INTRAVENOUS

## 2022-08-01 MED ORDER — LACTATED RINGERS IV SOLN: INTRAVENOUS | Status: DC

## 2022-08-01 MED ORDER — TRANEXAMIC ACID-NACL 1000-0.7 MG/100ML-% IV SOLN
1000.0000 mg | INTRAVENOUS | Status: AC
  Administered 2022-08-01: 1000 mg via INTRAVENOUS
  Filled 2022-08-01: qty 100

## 2022-08-01 MED ORDER — POVIDONE-IODINE 10 % EX SWAB
2.0000 | Freq: Once | CUTANEOUS | Status: AC
  Administered 2022-08-01: 2 via TOPICAL

## 2022-08-01 MED ORDER — ROPIVACAINE HCL 5 MG/ML IJ SOLN
INTRAMUSCULAR | Status: DC | PRN
  Administered 2022-08-01: 20 mL via PERINEURAL

## 2022-08-01 MED ORDER — ONDANSETRON HCL 4 MG PO TABS: 4.0000 mg | ORAL_TABLET | Freq: Four times a day (QID) | ORAL | Status: DC | PRN

## 2022-08-01 MED ORDER — SODIUM CHLORIDE 0.9 % IV SOLN: INTRAVENOUS | Status: DC

## 2022-08-01 MED ORDER — BUPIVACAINE-EPINEPHRINE (PF) 0.25% -1:200000 IJ SOLN
INTRAMUSCULAR | Status: AC
  Filled 2022-08-01: qty 30

## 2022-08-01 MED ORDER — BUPIVACAINE LIPOSOME 1.3 % IJ SUSP: 20.0000 mL | Freq: Once | INTRAMUSCULAR | Status: DC

## 2022-08-01 MED ORDER — PROPOFOL 10 MG/ML IV BOLUS
INTRAVENOUS | Status: AC
  Filled 2022-08-01: qty 20

## 2022-08-01 MED ORDER — METOCLOPRAMIDE HCL 5 MG PO TABS: 5.0000 mg | ORAL_TABLET | Freq: Three times a day (TID) | ORAL | Status: DC | PRN

## 2022-08-01 MED ORDER — SODIUM CHLORIDE (PF) 0.9 % IJ SOLN
INTRAMUSCULAR | Status: AC
  Filled 2022-08-01: qty 50

## 2022-08-01 MED ORDER — BUPIVACAINE IN DEXTROSE 0.75-8.25 % IT SOLN
INTRATHECAL | Status: DC | PRN
  Administered 2022-08-01: 1.6 mL via INTRATHECAL

## 2022-08-01 SURGICAL SUPPLY — 63 items
APL SKNCLS STERI-STRIP NONHPOA (GAUZE/BANDAGES/DRESSINGS) ×1
ATTUNE MED DOME PAT 41 KNEE (Knees) IMPLANT
ATTUNE PS FEM RT SZ 4 CEM KNEE (Femur) IMPLANT
ATTUNE PSRP INSR SZ4 8 KNEE (Insert) IMPLANT
BAG COUNTER SPONGE SURGICOUNT (BAG) ×1 IMPLANT
BAG DECANTER FOR FLEXI CONT (MISCELLANEOUS) ×1 IMPLANT
BAG SPEC THK2 15X12 ZIP CLS (MISCELLANEOUS) ×1
BAG SPNG CNTER NS LX DISP (BAG) ×1
BAG ZIPLOCK 12X15 (MISCELLANEOUS) ×1 IMPLANT
BASEPLATE TIBIAL ROTATING SZ 4 (Knees) IMPLANT
BENZOIN TINCTURE PRP APPL 2/3 (GAUZE/BANDAGES/DRESSINGS) ×1 IMPLANT
BLADE SAGITTAL 25.0X1.19X90 (BLADE) ×1 IMPLANT
BLADE SAW SGTL 13X75X1.27 (BLADE) ×1 IMPLANT
BLADE SURG 15 STRL LF DISP TIS (BLADE) ×1 IMPLANT
BLADE SURG 15 STRL SS (BLADE) ×1
BLADE SURG SZ10 CARB STEEL (BLADE) ×2 IMPLANT
BNDG CMPR MED 15X6 ELC VLCR LF (GAUZE/BANDAGES/DRESSINGS) ×1
BNDG ELASTIC 6X15 VLCR STRL LF (GAUZE/BANDAGES/DRESSINGS) ×1 IMPLANT
BONE CEMENT GENTAMICIN (Cement) ×2 IMPLANT
BOWL SMART MIX CTS (DISPOSABLE) ×1 IMPLANT
BSPLAT TIB 4 CMNT ROT PLAT STR (Knees) ×1 IMPLANT
CEMENT BONE GENTAMICIN 40 (Cement) IMPLANT
CLSR STERI-STRIP ANTIMIC 1/2X4 (GAUZE/BANDAGES/DRESSINGS) ×2 IMPLANT
COVER SURGICAL LIGHT HANDLE (MISCELLANEOUS) ×1 IMPLANT
CUFF TOURN SGL QUICK 34 (TOURNIQUET CUFF) ×1
CUFF TRNQT CYL 34X4.125X (TOURNIQUET CUFF) ×1 IMPLANT
DRAPE INCISE IOBAN 66X45 STRL (DRAPES) ×1 IMPLANT
DRAPE U-SHAPE 47X51 STRL (DRAPES) ×1 IMPLANT
DRESSING AQUACEL AG SP 3.5X10 (GAUZE/BANDAGES/DRESSINGS) ×1 IMPLANT
DRSG AQUACEL AG ADV 3.5X10 (GAUZE/BANDAGES/DRESSINGS) IMPLANT
DRSG AQUACEL AG SP 3.5X10 (GAUZE/BANDAGES/DRESSINGS) ×1
DURAPREP 26ML APPLICATOR (WOUND CARE) ×2 IMPLANT
ELECT REM PT RETURN 15FT ADLT (MISCELLANEOUS) ×1 IMPLANT
GLOVE BIOGEL PI IND STRL 8 (GLOVE) ×2 IMPLANT
GLOVE SURG ORTHO 8.0 STRL STRW (GLOVE) ×1 IMPLANT
GLOVE SURG POLYISO LF SZ7.5 (GLOVE) ×1 IMPLANT
GOWN STRL REUS W/ TWL XL LVL3 (GOWN DISPOSABLE) ×2 IMPLANT
GOWN STRL REUS W/TWL XL LVL3 (GOWN DISPOSABLE) ×2
HANDPIECE INTERPULSE COAX TIP (DISPOSABLE) ×1
HOLDER FOLEY CATH W/STRAP (MISCELLANEOUS) IMPLANT
HOOD PEEL AWAY FLYTE STAYCOOL (MISCELLANEOUS) ×1 IMPLANT
IMMOBILIZER KNEE 20 (SOFTGOODS) ×1
IMMOBILIZER KNEE 20 THIGH 36 (SOFTGOODS) ×1 IMPLANT
KIT TURNOVER KIT A (KITS) IMPLANT
MANIFOLD NEPTUNE II (INSTRUMENTS) ×1 IMPLANT
NEEDLE HYPO 22GX1.5 SAFETY (NEEDLE) ×2 IMPLANT
NS IRRIG 1000ML POUR BTL (IV SOLUTION) ×1 IMPLANT
PACK TOTAL KNEE CUSTOM (KITS) ×1 IMPLANT
PROTECTOR NERVE ULNAR (MISCELLANEOUS) ×1 IMPLANT
SET HNDPC FAN SPRY TIP SCT (DISPOSABLE) ×1 IMPLANT
SPIKE FLUID TRANSFER (MISCELLANEOUS) ×2 IMPLANT
SUT ETHIBOND NAB CT1 #1 30IN (SUTURE) ×2 IMPLANT
SUT MNCRL AB 3-0 PS2 18 (SUTURE) ×1 IMPLANT
SUT VIC AB 0 CT1 36 (SUTURE) ×1 IMPLANT
SUT VIC AB 2-0 CT1 27 (SUTURE) ×2
SUT VIC AB 2-0 CT1 TAPERPNT 27 (SUTURE) ×2 IMPLANT
SYR CONTROL 10ML LL (SYRINGE) ×3 IMPLANT
TOWEL OR 17X26 10 PK STRL BLUE (TOWEL DISPOSABLE) ×1 IMPLANT
TRAY FOLEY MTR SLVR 14FR STAT (SET/KITS/TRAYS/PACK) IMPLANT
TRAY FOLEY MTR SLVR 16FR STAT (SET/KITS/TRAYS/PACK) ×1 IMPLANT
TUBE SUCTION HIGH CAP CLEAR NV (SUCTIONS) ×1 IMPLANT
WATER STERILE IRR 1000ML POUR (IV SOLUTION) ×2 IMPLANT
WRAP KNEE MAXI GEL POST OP (GAUZE/BANDAGES/DRESSINGS) ×1 IMPLANT

## 2022-08-01 NOTE — Anesthesia Procedure Notes (Signed)
Procedure Name: MAC Date/Time: 08/01/2022 8:21 AM  Performed by: Eben Burow, CRNAPre-anesthesia Checklist: Patient identified, Emergency Drugs available, Suction available, Patient being monitored and Timeout performed Oxygen Delivery Method: Circle system utilized Placement Confirmation: positive ETCO2

## 2022-08-01 NOTE — Anesthesia Procedure Notes (Signed)
Anesthesia Regional Block: Adductor canal block   Pre-Anesthetic Checklist: , timeout performed,  Correct Patient, Correct Site, Correct Laterality,  Correct Procedure, Correct Position, site marked,  Risks and benefits discussed,  Surgical consent,  Pre-op evaluation,  At surgeon's request and post-op pain management  Laterality: Right  Prep: chloraprep       Needles:  Injection technique: Single-shot  Needle Type: Echogenic Needle     Needle Length: 9cm  Needle Gauge: 21     Additional Needles:   Procedures:,,,, ultrasound used (permanent image in chart),,    Narrative:  Start time: 08/01/2022 6:54 AM End time: 08/01/2022 7:00 AM Injection made incrementally with aspirations every 5 mL.  Performed by: Personally  Anesthesiologist: Santa Lighter, MD  Additional Notes: No pain on injection. No increased resistance to injection. Injection made in 5cc increments.  Good needle visualization.  Patient tolerated procedure well.

## 2022-08-01 NOTE — Anesthesia Postprocedure Evaluation (Signed)
Anesthesia Post Note  Patient: Bridget Perez, Bridget Perez  Procedure(s) Performed: TOTAL KNEE ARTHROPLASTY (Right: Knee)     Patient location during evaluation: PACU Anesthesia Type: Spinal Level of consciousness: awake, awake and alert and oriented Pain management: pain level controlled Vital Signs Assessment: post-procedure vital signs reviewed and stable Respiratory status: spontaneous breathing, nonlabored ventilation and respiratory function stable Cardiovascular status: blood pressure returned to baseline and stable Postop Assessment: no headache, no backache, spinal receding and no apparent nausea or vomiting Anesthetic complications: no   No notable events documented.  Last Vitals:  Vitals:   08/01/22 1136 08/01/22 1145  BP: (!) 111/57   Pulse: 81 79  Resp: 18   Temp:    SpO2: 100% 97%    Last Pain:  Vitals:   08/01/22 1245  TempSrc:   PainSc: 0-No pain                 Santa Lighter

## 2022-08-01 NOTE — Anesthesia Procedure Notes (Signed)
Procedure Name: MAC Date/Time: 08/01/2022 7:40 AM  Performed by: Eben Burow, CRNAPre-anesthesia Checklist: Patient identified, Emergency Drugs available, Suction available, Patient being monitored and Timeout performed Oxygen Delivery Method: Simple face mask Placement Confirmation: positive ETCO2

## 2022-08-01 NOTE — Brief Op Note (Signed)
08/01/2022  9:57 AM  PATIENT:  Bridget Perez  71 y.o. female  PRE-OPERATIVE DIAGNOSIS:  OA RIGHT KNEE  POST-OPERATIVE DIAGNOSIS:  OA RIGHT KNEE  PROCEDURE:  Procedure(s): TOTAL KNEE ARTHROPLASTY (Right)  SURGEON:  Surgeon(s) and Role:    * Earlie Server, MD - Primary  PHYSICIAN ASSISTANT: Chriss Czar, PA-C  ASSISTANTS: OR staff x1   ANESTHESIA:   local, regional, spinal, and IV sedation  EBL:  100 mL   BLOOD ADMINISTERED:none  DRAINS: none   LOCAL MEDICATIONS USED:  MARCAINE     SPECIMEN:  No Specimen  DISPOSITION OF SPECIMEN:  N/A  COUNTS:  YES  TOURNIQUET:   Total Tourniquet Time Documented: Thigh (Right) - 63 minutes Total: Thigh (Right) - 63 minutes   DICTATION: .Other Dictation: Dictation Number unknown  PLAN OF CARE: Discharge to home after PACU  PATIENT DISPOSITION:  PACU - hemodynamically stable.   Delay start of Pharmacological VTE agent (>24hrs) due to surgical blood loss or risk of bleeding: yes

## 2022-08-01 NOTE — Op Note (Signed)
NAMESHERI, Perez MEDICAL RECORD NO: [id number] ACCOUNT NO: [id number] DATE OF BIRTH: 1950/11/15 FACILITY: [facility] LOCATION: [facility] PHYSICIAN: W D. Valeta Harms., MD  Operative Report   DATE OF PROCEDURE: 08/01/2022  PREOPERATIVE DIAGNOSES:  Severe osteoarthritis, right knee with varus deformity; flexion contracture.  POSTOPERATIVE DIAGNOSES:  Severe osteoarthritis, right knee with varus deformity; flexion contracture.  PROCEDURE:  Right total knee replacement (DePuy Attune cemented knee, size 4 femur and tibia, 8-mm tibial bearing with 41 mm all poly patella.  SURGEON:  W D. Valeta Harms., MD  ASSISTANTMarjo Bicker.  ANESTHESIA:  Spinal block.  TOURNIQUET TIME:  63 minutes.  DESCRIPTION OF PROCEDURE:  Supine position. Exsanguination of the leg, inflation of thigh tourniquet to 350.  Straight skin incision with a medial parapatellar approach to the knee made.  We did a 10 mm 5-degree valgus cut on the femur, then cut  approximately 3 mm below the most diseased medial compartment with the extension gap, eventually measured at 8 mm. Stripping of the medial side of the knee due to the varus deformity as well.  We sized the femur to be a size 4, placed all-in-1 cutting  block in the appropriate degree of external rotation, accomplishing the anterior, posterior chamfer cuts.  Keel cut was made on the tibia, followed by the box cut on the femur.  Small osteophytes were removed from the posterior aspect of the knee.   Release of the PCL as well as infiltration of the capsular and soft tissues with Marcaine and Exparel with epinephrine.  Patella was cut, resecting a 7.5 mm patella with a 41 mm all poly trial placed.  With the trials in, she had full extension,  resolution of her varus deformity and flexion contracture.  Ligaments were all stable with full extension.  Cement was prepared on the back table with 1 gram of gentamicin per batch.  Cement was inserted in the doughy state, tibia  followed by femur,  patella.  Cement was allowed to harden with the trial bearing in.  Trial bearing was removed.  Small bits of cement were removed from the knee.  Tourniquet was released under direct vision, no excessive bleeding was noted.  Final bearing was placed.   Closure was affected with #1 Ethibond, 2-0 Vicryl and Monocryl in the skin.  Taken to recovery room in stable condition.   NIK D: 08/01/2022 9:26:15 am T: 08/01/2022 10:19:00 am  JOB: [id number]/ [id number]

## 2022-08-01 NOTE — Anesthesia Procedure Notes (Signed)
Spinal  Patient location during procedure: OR Start time: 08/01/2022 7:42 AM End time: 08/01/2022 7:45 AM Reason for block: surgical anesthesia Staffing Performed: anesthesiologist  Anesthesiologist: Santa Lighter, MD Performed by: Santa Lighter, MD Authorized by: Santa Lighter, MD   Preanesthetic Checklist Completed: patient identified, IV checked, risks and benefits discussed, surgical consent, monitors and equipment checked, pre-op evaluation and timeout performed Spinal Block Patient position: sitting Prep: DuraPrep and site prepped and draped Patient monitoring: continuous pulse ox and blood pressure Approach: midline Location: L3-4 Injection technique: single-shot Needle Needle type: Pencan  Needle gauge: 24 G Assessment Events: CSF return Additional Notes Functioning IV was confirmed and monitors were applied. Sterile prep and drape, including hand hygiene, mask and sterile gloves were used. The patient was positioned and the spine was prepped. The skin was anesthetized with lidocaine.  Free flow of clear CSF was obtained prior to injecting local anesthetic into the CSF.  The spinal needle aspirated freely following injection.  The needle was carefully withdrawn.  The patient tolerated the procedure well. Consent was obtained prior to procedure with all questions answered and concerns addressed. Risks including but not limited to bleeding, infection, nerve damage, paralysis, failed block, inadequate analgesia, allergic reaction, high spinal, itching and headache were discussed and the patient wished to proceed.   Hoy Morn, MD

## 2022-08-01 NOTE — Transfer of Care (Signed)
Immediate Anesthesia Transfer of Care Note  Patient: Bridget Perez  Procedure(s) Performed: TOTAL KNEE ARTHROPLASTY (Right: Knee)  Patient Location: PACU  Anesthesia Type:Spinal  Level of Consciousness: awake, alert  and patient cooperative  Airway & Oxygen Therapy: Patient Spontanous Breathing and Patient connected to face mask oxygen  Post-op Assessment: Report given to RN and Post -op Vital signs reviewed and stable  Post vital signs: Reviewed and stable  Last Vitals:  Vitals Value Taken Time  BP    Temp    Pulse    Resp    SpO2      Last Pain:  Vitals:   08/01/22 0621  TempSrc: Oral  PainSc:          Complications: No notable events documented.

## 2022-08-01 NOTE — Interval H&P Note (Signed)
History and Physical Interval Note:  08/01/2022 7:31 AM  Bridget Perez  has presented today for surgery, with the diagnosis of OA RIGHT KNEE.  The various methods of treatment have been discussed with the patient and family. After consideration of risks, benefits and other options for treatment, the patient has consented to  Procedure(s): TOTAL KNEE ARTHROPLASTY (Right) as a surgical intervention.  The patient's history has been reviewed, patient examined, no change in status, stable for surgery.  I have reviewed the patient's chart and labs.  Questions were answered to the patient's satisfaction.     Yvette Rack

## 2022-08-01 NOTE — Evaluation (Signed)
Physical Therapy Evaluation Patient Details Name: Bridget Perez MRN: [id number] DOB: 07/19/1951 Today's Date: 08/01/2022  History of Present Illness  Pt is a 71yo female presenting s/p R-TKA on 08/01/22. PMH: GERD, HTN  Clinical Impression  Bridget Perez is a 71 y.o. female POD 0 s/p R-tKA. Patient reports IND with mobility at baseline. Patient is now limited by functional impairments (see PT problem list below) and requires min guard for transfers and gait with RW. Patient was able to ambulate 40 feet with RW and min guard and cues for safe walker management. Patient educated on safe sequencing for stair mobility and verbalized safe guarding position for people assisting with mobility. Patient instructed in exercises to facilitate ROM and circulation. Patient will benefit from continued skilled PT interventions to address impairments and progress towards PLOF. Patient has met mobility goals at adequate level for discharge home; will continue to follow if pt continues acute stay to progress towards Mod I goals.       Recommendations for follow up therapy are one component of a multi-disciplinary discharge planning process, led by the attending physician.  Recommendations may be updated based on patient status, additional functional criteria and insurance authorization.  Follow Up Recommendations Follow physician's recommendations for discharge plan and follow up therapies      Assistance Recommended at Discharge Intermittent Supervision/Assistance  Patient can return home with the following  A little help with walking and/or transfers;A little help with bathing/dressing/bathroom;Assistance with cooking/housework;Assist for transportation;Help with stairs or ramp for entrance    Equipment Recommendations None recommended by PT  Recommendations for Other Services       Functional Status Assessment Patient has had a recent decline in their functional status and demonstrates the  ability to make significant improvements in function in a reasonable and predictable amount of time.     Precautions / Restrictions Precautions Precautions: Fall;Knee Precaution Booklet Issued: No Precaution Comments: No pillow underthe knee Restrictions Weight Bearing Restrictions: No Other Position/Activity Restrictions: wbat      Mobility  Bed Mobility Overal bed mobility: Needs Assistance Bed Mobility: Supine to Sit     Supine to sit: Min guard     General bed mobility comments: For safety only    Transfers Overall transfer level: Needs assistance Equipment used: Rolling walker (2 wheels) Transfers: Sit to/from Stand Sit to Stand: Min guard           General transfer comment: For safety only, pt completed from elevated stretcher height and BSC. VCs for hand placement and powering up through LLE    Ambulation/Gait Ambulation/Gait assistance: Min guard Gait Distance (Feet): 40 Feet Assistive device: Rolling walker (2 wheels) Gait Pattern/deviations: Step-to pattern Gait velocity: decreased     General Gait Details: Pt ambulated with RW and min guard, no physical assist required or overt LOB noted. VCs for sequencing and proximity to RW.  Stairs Stairs: Yes Stairs assistance: Min assist Stair Management: No rails, Step to pattern, Backwards, With walker Number of Stairs: 1 General stair comments: Pt educated on stair mobility and handout provided, pt verbalized understanding. Demonstrated safe technique with min assist for steadying RW, VCs for sequencing. NO overt LOB noted.  Wheelchair Mobility    Modified Rankin (Stroke Patients Only)       Balance Overall balance assessment: Needs assistance Sitting-balance support: Feet supported, No upper extremity supported Sitting balance-Leahy Scale: Good     Standing balance support: Reliant on assistive device for balance, During functional activity, Bilateral upper extremity supported Standing    balance-Leahy Scale: Poor                               Pertinent Vitals/Pain Pain Assessment Pain Assessment: 0-10 Pain Score: 3  Pain Location: right knee Pain Descriptors / Indicators: Operative site guarding Pain Intervention(s): Limited activity within patient's tolerance, Monitored during session, Repositioned    Home Living Family/patient expects to be discharged to:: Private residence Living Arrangements: Alone Available Help at Discharge: Family;Available 24 hours/day Type of Home: House Home Access: Stairs to enter Entrance Stairs-Rails: None Entrance Stairs-Number of Steps: 1 Alternate Level Stairs-Number of Steps: 14 Home Layout: Two level Home Equipment: Shower seat - built in;Rolling Walker (2 wheels) (Pt is looking for BSC)      Prior Function Prior Level of Function : Independent/Modified Independent;Working/employed ([facility])             Mobility Comments: IND ADLs Comments: IND     Hand Dominance        Extremity/Trunk Assessment   Upper Extremity Assessment Upper Extremity Assessment: Overall WFL for tasks assessed    Lower Extremity Assessment Lower Extremity Assessment: RLE deficits/detail;LLE deficits/detail RLE Deficits / Details: MMT ank DF/PF 5/5, no extensor lag noted RLE Sensation: WNL LLE Deficits / Details: MMT ank DF/PF 5/5 LLE Sensation: WNL    Cervical / Trunk Assessment Cervical / Trunk Assessment: Kyphotic  Communication   Communication: No difficulties  Cognition Arousal/Alertness: Awake/alert Behavior During Therapy: WFL for tasks assessed/performed Overall Cognitive Status: Within Functional Limits for tasks assessed                                          General Comments      Exercises Total Joint Exercises Ankle Circles/Pumps: AROM, Both, 10 reps Quad Sets: AROM, Right, Other reps (comment) (3) Short Arc Quad: AROM, Right, Other reps (comment) (3) Heel Slides: AROM, Right, 5  reps Hip ABduction/ADduction: AROM, Right, 5 reps Straight Leg Raises: AROM, Right, 5 reps Long Arc Quad: Other (comment) (educated, not performed) Goniometric ROM: -5-70 by gross visual approximation, seemed to be limited by ACE wrap   Assessment/Plan    PT Assessment Patient needs continued PT services  PT Problem List Decreased strength;Decreased range of motion;Decreased activity tolerance;Decreased balance;Decreased mobility;Decreased coordination;Pain       PT Treatment Interventions DME instruction;Gait training;Stair training;Functional mobility training;Therapeutic activities;Therapeutic exercise;Balance training;Neuromuscular re-education;Patient/family education    PT Goals (Current goals can be found in the Care Plan section)  Acute Rehab PT Goals Patient Stated Goal: to go home PT Goal Formulation: With patient Time For Goal Achievement: 08/08/22 Potential to Achieve Goals: Good    Frequency 7X/week     Co-evaluation               AM-PAC PT "6 Clicks" Mobility  Outcome Measure Help needed turning from your back to your side while in a flat bed without using bedrails?: None Help needed moving from lying on your back to sitting on the side of a flat bed without using bedrails?: A Little Help needed moving to and from a bed to a chair (including a wheelchair)?: A Little Help needed standing up from a chair using your arms (e.g., wheelchair or bedside chair)?: A Little Help needed to walk in hospital room?: A Little Help needed climbing 3-5 steps with a railing? : A Little 6   Click Score: 19    End of Session Equipment Utilized During Treatment: Gait belt Activity Tolerance: Patient tolerated treatment well;No increased pain Patient left: in chair;with call bell/phone within reach (stretcher in PACU) Nurse Communication: Mobility status PT Visit Diagnosis: Difficulty in walking, not elsewhere classified (R26.2);Pain Pain - Right/Left: Right Pain - part of  body: Knee    Time: 1230-1300 PT Time Calculation (min) (ACUTE ONLY): 30 min   Charges:   PT Evaluation $PT Eval Low Complexity: 1 Low PT Treatments $Gait Training: 8-22 mins        William Goldsmith, PT, DPT [facility] Department Office: [phone] Weekend pager: [phone]  William Goldsmith 08/01/2022, 1:19 PM 

## 2022-08-01 NOTE — Discharge Instructions (Signed)

## 2022-08-04 ENCOUNTER — Encounter (HOSPITAL_COMMUNITY): Payer: Self-pay | Admitting: Orthopedic Surgery

## 2022-08-29 ENCOUNTER — Encounter: Payer: Self-pay | Admitting: Podiatry

## 2022-08-29 ENCOUNTER — Ambulatory Visit: Payer: BC Managed Care – PPO | Admitting: Podiatry

## 2022-08-29 DIAGNOSIS — M76822 Posterior tibial tendinitis, left leg: Secondary | ICD-10-CM

## 2022-08-29 DIAGNOSIS — M19072 Primary osteoarthritis, left ankle and foot: Secondary | ICD-10-CM

## 2022-08-29 DIAGNOSIS — I2510 Atherosclerotic heart disease of native coronary artery without angina pectoris: Secondary | ICD-10-CM

## 2022-08-29 NOTE — Progress Notes (Signed)
Subjective:   Patient ID: Bridget Perez, female   DOB: 71 y.o.   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  I will contact the doctor next week and I reviewed this with Dr. Criss Rosales and she would like to get this done and see the physician there and we will arrange the appointment.  I did discuss the complexity of this procedure and request

## 2022-09-15 ENCOUNTER — Other Ambulatory Visit: Payer: Self-pay

## 2022-09-15 DIAGNOSIS — M76822 Posterior tibial tendinitis, left leg: Secondary | ICD-10-CM

## 2022-09-15 DIAGNOSIS — M19072 Primary osteoarthritis, left ankle and foot: Secondary | ICD-10-CM

## 2022-09-19 ENCOUNTER — Ambulatory Visit: Payer: BC Managed Care – PPO | Admitting: Podiatry

## 2022-09-19 DIAGNOSIS — M19072 Primary osteoarthritis, left ankle and foot: Secondary | ICD-10-CM

## 2022-09-19 DIAGNOSIS — M7752 Other enthesopathy of left foot: Secondary | ICD-10-CM

## 2022-09-20 ENCOUNTER — Encounter: Payer: Self-pay | Admitting: Podiatry

## 2022-09-20 DIAGNOSIS — M7752 Other enthesopathy of left foot: Secondary | ICD-10-CM | POA: Diagnosis not present

## 2022-09-20 MED ORDER — TRIAMCINOLONE ACETONIDE 10 MG/ML IJ SUSP
10.0000 mg | Freq: Once | INTRAMUSCULAR | Status: AC
  Administered 2022-09-20: 10 mg

## 2022-09-20 NOTE — Progress Notes (Signed)
Subjective:   Patient ID: Bridget Perez, female   DOB: 71 y.o.   MRN: [id number]   HPI Patient states she has been getting a lot more pain in the outside of her left ankle and subtalar joint and she is just trying to make it until her appointment with Dr. Lorin Perez on December 12.  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  I then applied a Tri-Lock ankle brace to try to control him in 3 planes and take the pressure off the area.  Patient will be seen back in future and will keep up with her after her evaluation by Dr. Lorin Perez

## 2022-10-08 IMAGING — CR DG SI JOINTS 3+V
3 series · 3 of 3 positions shown · non-contrast
Comparison: None.

CLINICAL DATA: Pain following fall

EXAM:
BILATERAL SACROILIAC JOINTS - 3+ VIEW

[t sacrum ap]
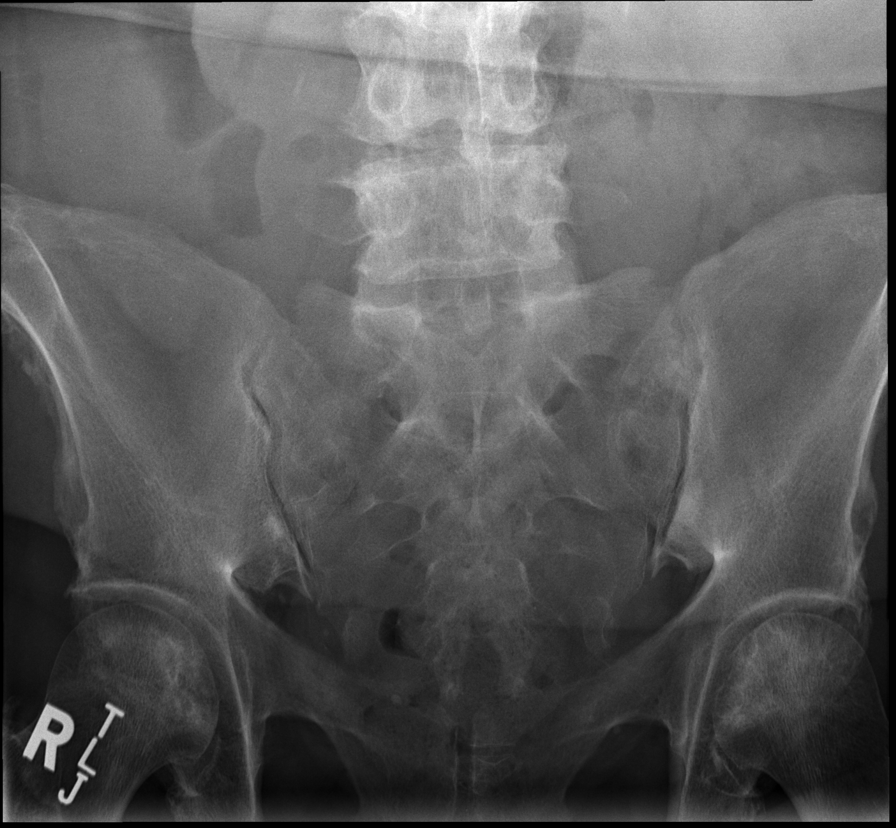

[t sacroiliac joints (1 of 2)]
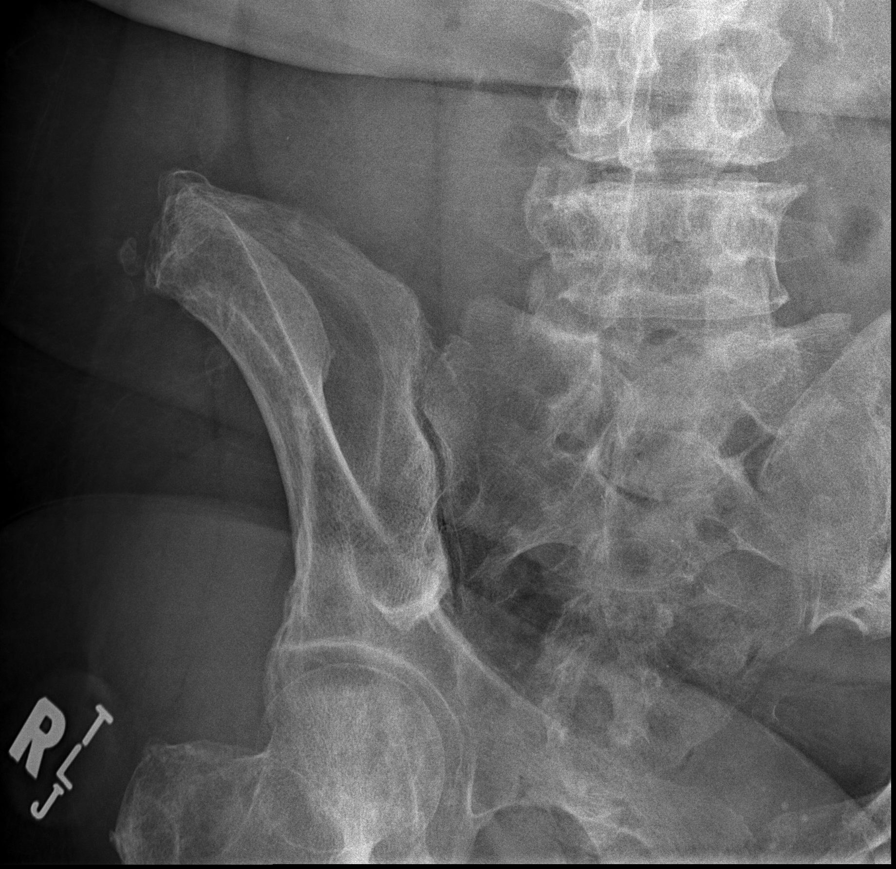

[t sacroiliac joints (2 of 2)]
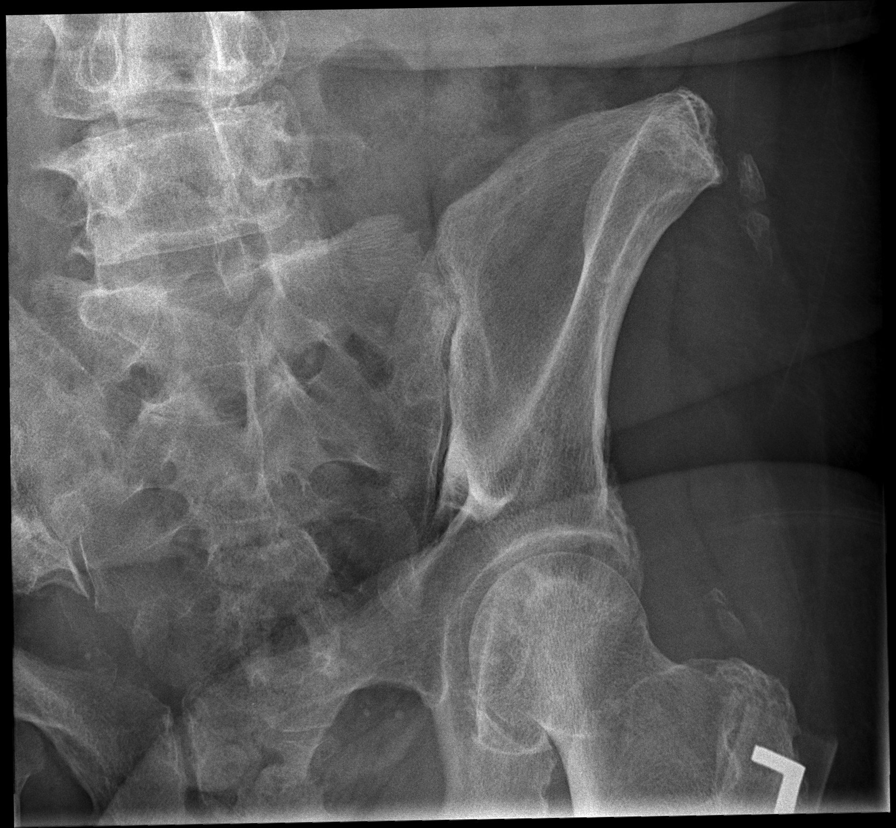

[3 of 3 positions shown; findings below may reference images not displayed]

FINDINGS: Angled frontal as well as bilateral oblique views were obtained.
There is no evident fracture or diastasis. Sacroiliac joints appear
normal. Areas of sclerosis in each femoral head raises concern for
potential degree of avascular necrosis in the femoral heads.
IMPRESSION: No sacroiliac joint fracture or diastasis. No sacroiliitis or
appreciable arthropathy involving the sacroiliac joints.

Sclerosis in portions of each femoral head raises concern for a
degree of avascular necrosis bilaterally. MR would be the optimum
imaging study of choice to further evaluate for potential femoral
head avascular necrosis.

## 2024-05-02 ENCOUNTER — Other Ambulatory Visit (HOSPITAL_BASED_OUTPATIENT_CLINIC_OR_DEPARTMENT_OTHER): Payer: Self-pay

## 2024-05-27 ENCOUNTER — Ambulatory Visit (INDEPENDENT_AMBULATORY_CARE_PROVIDER_SITE_OTHER)

## 2024-05-27 ENCOUNTER — Ambulatory Visit (INDEPENDENT_AMBULATORY_CARE_PROVIDER_SITE_OTHER): Admitting: Podiatry

## 2024-05-27 ENCOUNTER — Encounter: Payer: Self-pay | Admitting: Podiatry

## 2024-05-27 DIAGNOSIS — M7752 Other enthesopathy of left foot: Secondary | ICD-10-CM | POA: Diagnosis not present

## 2024-05-27 DIAGNOSIS — M2042 Other hammer toe(s) (acquired), left foot: Secondary | ICD-10-CM

## 2024-05-29 NOTE — Progress Notes (Signed)
 Subjective:   Patient ID: Bridget Perez, female   DOB: 73 y.o.   MRN: [id number]   HPI Patient presents with a lot of pain in the 4th and 5th toes of her left foot and did have a ankle and subtalar joint fusion that was done approximately 1 year ago   ROS      Objective:  Physical Exam  Neurovascular status is found to be intact with what appears to be a well position subtalar and ankle fusion left with also talonavicular fusion along with the other 2.  Is found to have discomfort on the 4th and 5th digits on the left which do have deformity with adductovarus rotation which I believe may be causing pressure against the edges of the toes     Assessment:  Patient's has had extensive left foot surgery with pain in the 4th and 5th digits which I think is due to the position of the toe     Plan:  H&P reviewed and at this point I have recommended cushioning of 2 different types with 1 being crest pad 1 being digital pads I want to see the results of this and I think she may require derotational arthroplasty to try to offload weight from these digits.  I want to wait and see how she does with those over the next month and then we can decide what to do  X-rays indicate that the fixation for the fusion looks good with good alignment noted

## 2024-07-18 ENCOUNTER — Ambulatory Visit (INDEPENDENT_AMBULATORY_CARE_PROVIDER_SITE_OTHER): Admitting: Podiatry

## 2024-07-18 ENCOUNTER — Encounter: Payer: Self-pay | Admitting: Podiatry

## 2024-07-18 ENCOUNTER — Ambulatory Visit (INDEPENDENT_AMBULATORY_CARE_PROVIDER_SITE_OTHER)

## 2024-07-18 VITALS — Ht 63.5 in | Wt 170.0 lb

## 2024-07-18 DIAGNOSIS — S9032XA Contusion of left foot, initial encounter: Secondary | ICD-10-CM

## 2024-07-20 ENCOUNTER — Encounter: Payer: Self-pay | Admitting: Podiatry

## 2024-07-20 NOTE — Progress Notes (Signed)
 Subjective:   Patient ID: Bridget Perez, female   DOB: 73 y.o.   MRN: [id number]   HPI Patient presents stating that she tripped on her left foot today and thinks she broke her big toe.  States it has been very tender and she is wearing a boot  currently   ROS      Objective:  Physical Exam  Neurovascular status was found to be intact muscle strength adequate significant swelling of the left hallux with no bruising or no nail damage noted.  Good digital perfusion     Assessment:  Probable fracture of the left hallux possible spinning or other pathological type fracture     Plan:  H&P reviewed at this point I anesthetized the left big toe and I did attempt a closed reduction and do think I was able to put it in a slightly better position and continue boot usage and dispensed surgical shoe.  Patient will be seen back to reevaluate and may require other procedure but I do think it will heal uneventfully at this position  X-rays indicate fracture of the left proximal phalanx mid shaft with shortening noted of the phalanx secondary to the injury
